# Patient Record
Sex: Female | Born: 1955 | Race: White | Hispanic: No | Smoking: Current every day smoker
Health system: Southern US, Community
[De-identification: ages and names within clinical notes are randomized; demographics above are authoritative.]

## PROBLEM LIST (undated history)

## (undated) DIAGNOSIS — C801 Malignant (primary) neoplasm, unspecified: Secondary | ICD-10-CM

## (undated) HISTORY — PX: CHOLECYSTECTOMY: SHX55

## (undated) HISTORY — PX: CERVICAL CONE BIOPSY: SUR198

## (undated) HISTORY — PX: ABDOMINAL HYSTERECTOMY: SHX81

---

## 1998-02-12 ENCOUNTER — Other Ambulatory Visit: Admission: RE | Admit: 1998-02-12 | Discharge: 1998-02-12 | Payer: Self-pay | Admitting: Obstetrics & Gynecology

## 1998-07-21 ENCOUNTER — Emergency Department (HOSPITAL_COMMUNITY): Admission: EM | Admit: 1998-07-21 | Discharge: 1998-07-21 | Payer: Self-pay | Admitting: Emergency Medicine

## 1998-07-26 ENCOUNTER — Ambulatory Visit (HOSPITAL_COMMUNITY): Admission: RE | Admit: 1998-07-26 | Discharge: 1998-07-26 | Payer: Self-pay | Admitting: Gastroenterology

## 1998-07-26 ENCOUNTER — Encounter: Payer: Self-pay | Admitting: Gastroenterology

## 1998-08-01 ENCOUNTER — Ambulatory Visit (HOSPITAL_COMMUNITY): Admission: RE | Admit: 1998-08-01 | Discharge: 1998-08-01 | Payer: Self-pay | Admitting: Gastroenterology

## 1999-02-04 ENCOUNTER — Ambulatory Visit (HOSPITAL_COMMUNITY): Admission: RE | Admit: 1999-02-04 | Discharge: 1999-02-04 | Payer: Self-pay | Admitting: Infectious Diseases

## 1999-02-04 ENCOUNTER — Encounter: Payer: Self-pay | Admitting: Infectious Diseases

## 1999-05-06 ENCOUNTER — Ambulatory Visit (HOSPITAL_COMMUNITY): Admission: RE | Admit: 1999-05-06 | Discharge: 1999-05-06 | Payer: Self-pay | Admitting: Internal Medicine

## 1999-05-06 ENCOUNTER — Encounter: Payer: Self-pay | Admitting: Internal Medicine

## 1999-08-27 ENCOUNTER — Encounter: Admission: RE | Admit: 1999-08-27 | Discharge: 1999-11-25 | Payer: Self-pay | Admitting: *Deleted

## 1999-09-30 ENCOUNTER — Encounter: Payer: Self-pay | Admitting: *Deleted

## 1999-09-30 ENCOUNTER — Encounter: Admission: RE | Admit: 1999-09-30 | Discharge: 1999-09-30 | Payer: Self-pay | Admitting: *Deleted

## 1999-10-09 ENCOUNTER — Other Ambulatory Visit: Admission: RE | Admit: 1999-10-09 | Discharge: 1999-10-09 | Payer: Self-pay | Admitting: Obstetrics & Gynecology

## 1999-11-25 ENCOUNTER — Encounter: Admission: RE | Admit: 1999-11-25 | Discharge: 1999-12-16 | Payer: Self-pay | Admitting: *Deleted

## 2000-05-03 ENCOUNTER — Encounter: Payer: Self-pay | Admitting: Internal Medicine

## 2000-05-03 ENCOUNTER — Ambulatory Visit (HOSPITAL_COMMUNITY): Admission: RE | Admit: 2000-05-03 | Discharge: 2000-05-03 | Payer: Self-pay | Admitting: Internal Medicine

## 2000-12-08 ENCOUNTER — Other Ambulatory Visit: Admission: RE | Admit: 2000-12-08 | Discharge: 2000-12-08 | Payer: Self-pay | Admitting: Obstetrics & Gynecology

## 2001-05-16 ENCOUNTER — Ambulatory Visit (HOSPITAL_COMMUNITY): Admission: RE | Admit: 2001-05-16 | Discharge: 2001-05-16 | Payer: Self-pay | Admitting: Obstetrics & Gynecology

## 2001-05-16 ENCOUNTER — Encounter: Payer: Self-pay | Admitting: Obstetrics & Gynecology

## 2001-08-18 ENCOUNTER — Ambulatory Visit (HOSPITAL_COMMUNITY): Admission: RE | Admit: 2001-08-18 | Discharge: 2001-08-18 | Payer: Self-pay | Admitting: Gastroenterology

## 2001-08-18 ENCOUNTER — Encounter: Payer: Self-pay | Admitting: Gastroenterology

## 2001-08-23 ENCOUNTER — Emergency Department (HOSPITAL_COMMUNITY): Admission: EM | Admit: 2001-08-23 | Discharge: 2001-08-23 | Payer: Self-pay | Admitting: Emergency Medicine

## 2001-08-30 ENCOUNTER — Encounter: Payer: Self-pay | Admitting: Gastroenterology

## 2001-08-30 ENCOUNTER — Ambulatory Visit (HOSPITAL_COMMUNITY): Admission: RE | Admit: 2001-08-30 | Discharge: 2001-08-30 | Payer: Self-pay | Admitting: Gastroenterology

## 2001-12-15 ENCOUNTER — Other Ambulatory Visit: Admission: RE | Admit: 2001-12-15 | Discharge: 2001-12-15 | Payer: Self-pay | Admitting: Obstetrics & Gynecology

## 2003-01-29 ENCOUNTER — Other Ambulatory Visit: Admission: RE | Admit: 2003-01-29 | Discharge: 2003-01-29 | Payer: Self-pay | Admitting: Obstetrics & Gynecology

## 2004-02-18 ENCOUNTER — Other Ambulatory Visit: Admission: RE | Admit: 2004-02-18 | Discharge: 2004-02-18 | Payer: Self-pay | Admitting: Obstetrics & Gynecology

## 2004-12-04 ENCOUNTER — Ambulatory Visit: Payer: Self-pay | Admitting: Internal Medicine

## 2004-12-17 ENCOUNTER — Ambulatory Visit: Payer: Self-pay | Admitting: Internal Medicine

## 2005-01-14 ENCOUNTER — Ambulatory Visit: Payer: Self-pay | Admitting: Internal Medicine

## 2005-01-28 ENCOUNTER — Ambulatory Visit: Payer: Self-pay | Admitting: Internal Medicine

## 2005-02-24 ENCOUNTER — Ambulatory Visit: Payer: Self-pay | Admitting: Internal Medicine

## 2005-03-24 ENCOUNTER — Ambulatory Visit: Payer: Self-pay | Admitting: Internal Medicine

## 2005-04-23 ENCOUNTER — Ambulatory Visit: Payer: Self-pay | Admitting: Internal Medicine

## 2005-05-14 ENCOUNTER — Ambulatory Visit: Payer: Self-pay | Admitting: Internal Medicine

## 2005-05-25 ENCOUNTER — Ambulatory Visit: Payer: Self-pay | Admitting: Internal Medicine

## 2005-05-27 ENCOUNTER — Ambulatory Visit: Payer: Self-pay | Admitting: Internal Medicine

## 2005-06-23 ENCOUNTER — Ambulatory Visit: Payer: Self-pay | Admitting: Internal Medicine

## 2005-07-23 ENCOUNTER — Ambulatory Visit: Payer: Self-pay | Admitting: Internal Medicine

## 2005-08-24 ENCOUNTER — Ambulatory Visit: Payer: Self-pay | Admitting: Internal Medicine

## 2005-09-23 ENCOUNTER — Ambulatory Visit: Payer: Self-pay | Admitting: Internal Medicine

## 2005-09-29 ENCOUNTER — Ambulatory Visit: Payer: Self-pay | Admitting: Internal Medicine

## 2005-10-20 ENCOUNTER — Ambulatory Visit: Payer: Self-pay | Admitting: Internal Medicine

## 2005-11-17 ENCOUNTER — Ambulatory Visit: Payer: Self-pay | Admitting: Internal Medicine

## 2005-12-17 ENCOUNTER — Ambulatory Visit: Payer: Self-pay | Admitting: Internal Medicine

## 2006-01-18 ENCOUNTER — Ambulatory Visit: Payer: Self-pay | Admitting: Internal Medicine

## 2006-02-15 ENCOUNTER — Ambulatory Visit: Payer: Self-pay | Admitting: Internal Medicine

## 2006-03-29 ENCOUNTER — Ambulatory Visit: Payer: Self-pay | Admitting: Internal Medicine

## 2006-04-27 ENCOUNTER — Ambulatory Visit: Payer: Self-pay | Admitting: Internal Medicine

## 2006-05-06 ENCOUNTER — Ambulatory Visit: Payer: Self-pay | Admitting: Internal Medicine

## 2006-05-07 ENCOUNTER — Ambulatory Visit: Payer: Self-pay | Admitting: Internal Medicine

## 2006-05-21 ENCOUNTER — Ambulatory Visit: Payer: Self-pay | Admitting: Internal Medicine

## 2006-06-03 ENCOUNTER — Ambulatory Visit: Payer: Self-pay | Admitting: Licensed Clinical Social Worker

## 2006-06-10 ENCOUNTER — Ambulatory Visit: Payer: Self-pay | Admitting: Licensed Clinical Social Worker

## 2006-06-16 ENCOUNTER — Ambulatory Visit: Payer: Self-pay | Admitting: Internal Medicine

## 2006-06-17 ENCOUNTER — Ambulatory Visit: Payer: Self-pay | Admitting: Licensed Clinical Social Worker

## 2006-06-25 ENCOUNTER — Ambulatory Visit: Payer: Self-pay | Admitting: Internal Medicine

## 2006-07-05 ENCOUNTER — Ambulatory Visit: Payer: Self-pay | Admitting: Licensed Clinical Social Worker

## 2006-07-28 ENCOUNTER — Ambulatory Visit: Payer: Self-pay | Admitting: Internal Medicine

## 2006-08-13 ENCOUNTER — Ambulatory Visit: Payer: Self-pay | Admitting: Licensed Clinical Social Worker

## 2006-08-27 ENCOUNTER — Encounter: Admission: RE | Admit: 2006-08-27 | Discharge: 2006-08-27 | Payer: Self-pay | Admitting: Internal Medicine

## 2006-09-07 ENCOUNTER — Ambulatory Visit: Payer: Self-pay | Admitting: Internal Medicine

## 2006-09-07 LAB — CONVERTED CEMR LAB
ALT: 12 units/L (ref 0–40)
AST: 17 units/L (ref 0–37)
BUN: 4 mg/dL — ABNORMAL LOW (ref 6–23)
Chloride: 103 meq/L (ref 96–112)
Creatinine, Ser: 0.7 mg/dL (ref 0.4–1.2)
GFR calc non Af Amer: 93 mL/min
Glomerular Filtration Rate, Af Am: 113 mL/min/{1.73_m2}
Potassium: 4.5 meq/L (ref 3.5–5.1)
Sodium: 141 meq/L (ref 135–145)
Total Bilirubin: 0.5 mg/dL (ref 0.3–1.2)

## 2006-09-28 ENCOUNTER — Ambulatory Visit: Payer: Self-pay | Admitting: Internal Medicine

## 2006-10-22 ENCOUNTER — Ambulatory Visit: Payer: Self-pay | Admitting: Internal Medicine

## 2006-10-22 LAB — CONVERTED CEMR LAB
BUN: 9 mg/dL (ref 6–23)
Creatinine, Ser: 0.8 mg/dL (ref 0.4–1.2)

## 2006-11-22 ENCOUNTER — Ambulatory Visit: Payer: Self-pay | Admitting: Internal Medicine

## 2006-12-21 ENCOUNTER — Ambulatory Visit: Payer: Self-pay | Admitting: Internal Medicine

## 2006-12-22 ENCOUNTER — Ambulatory Visit (HOSPITAL_BASED_OUTPATIENT_CLINIC_OR_DEPARTMENT_OTHER): Admission: RE | Admit: 2006-12-22 | Discharge: 2006-12-22 | Payer: Self-pay | Admitting: Internal Medicine

## 2007-01-03 ENCOUNTER — Ambulatory Visit: Payer: Self-pay | Admitting: Pulmonary Disease

## 2007-01-28 ENCOUNTER — Ambulatory Visit: Payer: Self-pay | Admitting: Internal Medicine

## 2007-02-21 ENCOUNTER — Encounter: Admission: RE | Admit: 2007-02-21 | Discharge: 2007-02-21 | Payer: Self-pay | Admitting: Internal Medicine

## 2007-03-09 ENCOUNTER — Ambulatory Visit: Payer: Self-pay | Admitting: Cardiology

## 2007-03-09 ENCOUNTER — Ambulatory Visit: Payer: Self-pay | Admitting: Internal Medicine

## 2007-04-04 ENCOUNTER — Ambulatory Visit: Payer: Self-pay | Admitting: Internal Medicine

## 2007-04-14 DIAGNOSIS — F329 Major depressive disorder, single episode, unspecified: Secondary | ICD-10-CM

## 2007-05-30 ENCOUNTER — Ambulatory Visit: Payer: Self-pay | Admitting: Internal Medicine

## 2007-05-30 DIAGNOSIS — F489 Nonpsychotic mental disorder, unspecified: Secondary | ICD-10-CM

## 2007-05-30 DIAGNOSIS — F5105 Insomnia due to other mental disorder: Secondary | ICD-10-CM | POA: Insufficient documentation

## 2007-05-30 DIAGNOSIS — IMO0001 Reserved for inherently not codable concepts without codable children: Secondary | ICD-10-CM

## 2007-06-08 ENCOUNTER — Telehealth (INDEPENDENT_AMBULATORY_CARE_PROVIDER_SITE_OTHER): Payer: Self-pay | Admitting: *Deleted

## 2007-06-16 ENCOUNTER — Telehealth: Payer: Self-pay | Admitting: Internal Medicine

## 2007-07-08 ENCOUNTER — Telehealth: Payer: Self-pay | Admitting: *Deleted

## 2007-07-15 ENCOUNTER — Telehealth (INDEPENDENT_AMBULATORY_CARE_PROVIDER_SITE_OTHER): Payer: Self-pay | Admitting: *Deleted

## 2007-07-15 ENCOUNTER — Telehealth: Payer: Self-pay | Admitting: Internal Medicine

## 2007-07-20 ENCOUNTER — Telehealth: Payer: Self-pay | Admitting: *Deleted

## 2007-08-08 ENCOUNTER — Ambulatory Visit: Payer: Self-pay | Admitting: Internal Medicine

## 2007-08-12 ENCOUNTER — Telehealth: Payer: Self-pay | Admitting: Internal Medicine

## 2007-08-12 ENCOUNTER — Ambulatory Visit: Payer: Self-pay | Admitting: Internal Medicine

## 2007-08-12 ENCOUNTER — Telehealth: Payer: Self-pay | Admitting: *Deleted

## 2007-08-12 DIAGNOSIS — R5383 Other fatigue: Secondary | ICD-10-CM

## 2007-08-12 DIAGNOSIS — J329 Chronic sinusitis, unspecified: Secondary | ICD-10-CM | POA: Insufficient documentation

## 2007-08-12 DIAGNOSIS — R5381 Other malaise: Secondary | ICD-10-CM

## 2007-08-12 LAB — CONVERTED CEMR LAB
Basophils Absolute: 0 10*3/uL (ref 0.0–0.1)
Eosinophils Absolute: 0.4 10*3/uL (ref 0.0–0.6)
Eosinophils Relative: 2.9 % (ref 0.0–5.0)
HCT: 46.5 % — ABNORMAL HIGH (ref 36.0–46.0)
Lymphocytes Relative: 19 % (ref 12.0–46.0)
MCHC: 35.1 g/dL (ref 30.0–36.0)
MCV: 91.4 fL (ref 78.0–100.0)
Monocytes Absolute: 0.5 10*3/uL (ref 0.2–0.7)
Neutro Abs: 10 10*3/uL — ABNORMAL HIGH (ref 1.4–7.7)
Neutrophils Relative %: 74 % (ref 43.0–77.0)
RBC: 5.09 M/uL (ref 3.87–5.11)
TSH: 1.84 microintl units/mL (ref 0.35–5.50)
WBC: 13.4 10*3/uL — ABNORMAL HIGH (ref 4.5–10.5)

## 2007-08-26 ENCOUNTER — Telehealth: Payer: Self-pay | Admitting: Internal Medicine

## 2007-08-30 ENCOUNTER — Telehealth: Payer: Self-pay | Admitting: Internal Medicine

## 2007-09-01 ENCOUNTER — Telehealth: Payer: Self-pay | Admitting: Internal Medicine

## 2007-09-07 ENCOUNTER — Telehealth (INDEPENDENT_AMBULATORY_CARE_PROVIDER_SITE_OTHER): Payer: Self-pay | Admitting: *Deleted

## 2007-09-09 ENCOUNTER — Telehealth: Payer: Self-pay | Admitting: Internal Medicine

## 2007-12-22 ENCOUNTER — Telehealth: Payer: Self-pay | Admitting: Internal Medicine

## 2011-03-05 ENCOUNTER — Other Ambulatory Visit: Payer: Self-pay | Admitting: Obstetrics & Gynecology

## 2011-03-05 ENCOUNTER — Ambulatory Visit (HOSPITAL_COMMUNITY)
Admission: AD | Admit: 2011-03-05 | Discharge: 2011-03-05 | Disposition: A | Discharge status: Home / Self Care | Payer: Managed Care, Other (non HMO) | Source: Ambulatory Visit | Attending: Family Medicine | Admitting: Family Medicine

## 2011-03-05 ENCOUNTER — Emergency Department (INDEPENDENT_AMBULATORY_CARE_PROVIDER_SITE_OTHER): Payer: Managed Care, Other (non HMO)

## 2011-03-05 ENCOUNTER — Emergency Department (HOSPITAL_BASED_OUTPATIENT_CLINIC_OR_DEPARTMENT_OTHER)
Admission: EM | Admit: 2011-03-05 | Discharge: 2011-03-05 | Disposition: A | Discharge status: Other Acute Inpatient Hospital | Payer: Managed Care, Other (non HMO) | Attending: Emergency Medicine | Admitting: Emergency Medicine

## 2011-03-05 DIAGNOSIS — N857 Hematometra: Secondary | ICD-10-CM | POA: Insufficient documentation

## 2011-03-05 DIAGNOSIS — N938 Other specified abnormal uterine and vaginal bleeding: Secondary | ICD-10-CM | POA: Insufficient documentation

## 2011-03-05 DIAGNOSIS — N950 Postmenopausal bleeding: Secondary | ICD-10-CM

## 2011-03-05 DIAGNOSIS — N882 Stricture and stenosis of cervix uteri: Secondary | ICD-10-CM

## 2011-03-05 DIAGNOSIS — N949 Unspecified condition associated with female genital organs and menstrual cycle: Secondary | ICD-10-CM | POA: Insufficient documentation

## 2011-03-05 DIAGNOSIS — N898 Other specified noninflammatory disorders of vagina: Secondary | ICD-10-CM | POA: Insufficient documentation

## 2011-03-05 DIAGNOSIS — F17200 Nicotine dependence, unspecified, uncomplicated: Secondary | ICD-10-CM | POA: Insufficient documentation

## 2011-03-05 LAB — CBC
HCT: 44 % (ref 36.0–46.0)
Hemoglobin: 14.8 g/dL (ref 12.0–15.0)
MCH: 30.6 pg (ref 26.0–34.0)
MCH: 30.5 pg (ref 26.0–34.0)
MCHC: 33.6 g/dL (ref 30.0–36.0)
MCV: 90.9 fL (ref 78.0–100.0)
MCV: 93.7 fL (ref 78.0–100.0)
Platelets: 194 10*3/uL (ref 150–400)
RDW: 13.5 % (ref 11.5–15.5)
RDW: 13.6 % (ref 11.5–15.5)
WBC: 9.7 10*3/uL (ref 4.0–10.5)

## 2011-03-05 LAB — DIFFERENTIAL
Basophils Absolute: 0 10*3/uL (ref 0.0–0.1)
Eosinophils Relative: 2 % (ref 0–5)
Lymphocytes Relative: 22 % (ref 12–46)
Lymphs Abs: 2 10*3/uL (ref 0.7–4.0)
Monocytes Absolute: 0.6 10*3/uL (ref 0.1–1.0)
Monocytes Relative: 7 % (ref 3–12)
Neutro Abs: 6.4 10*3/uL (ref 1.7–7.7)

## 2011-03-05 LAB — TYPE AND SCREEN: ABO/RH(D): A POS

## 2011-03-05 LAB — URINALYSIS, ROUTINE W REFLEX MICROSCOPIC
Glucose, UA: NEGATIVE mg/dL
Leukocytes, UA: NEGATIVE
Protein, ur: NEGATIVE mg/dL
Specific Gravity, Urine: 1.015 (ref 1.005–1.030)

## 2011-03-05 LAB — WET PREP, GENITAL: Trich, Wet Prep: NONE SEEN

## 2011-03-05 LAB — URINE MICROSCOPIC-ADD ON

## 2011-03-06 LAB — GC/CHLAMYDIA PROBE AMP, GENITAL: Chlamydia, DNA Probe: NEGATIVE

## 2011-03-06 NOTE — Letter (Signed)
August 16, 2006     Ms. [name] [name]  c/o [employer]  [address]  [place], [place]  [address]   RE:  SILVANA, HOLECEK  MRN:  [id number]  /  DOB:  04/13/1956   Dear Ms. Urso:   I am writing in response to your request for information regarding Marnie Fazzino.  I have seen her 3 times since June 03, 2006.  She was referred by  her primary care physician Dr. Darryll Capers.  She suffers from depression  and anxiety and has been unable to work for some time due to fibromyalgia  and migraine headaches.  Ms. Pruden is struggling with some family issues in  terms of having to care for her elderly parents.  Her mother is severely  mentally ill, and this is exceedingly difficult for Ms. Bednarczyk because she  receives no help from her sister with this issue.  Ms. Barnhardt is frequently  ill, consequently has been unable to attend 6 of 9 scheduled appointments.  She frequently has headaches or fever or colds or other conditions which  keep her in the house.   I have completed the questionnaire which you enclosed.  All of the  information which I have gathered from Ms.  Maltese in our 3 sessions together  which occurred on August 16, August 30, and July 05, 2006 is included  in this completed questionnaire.   I hope the above is helpful to you in evaluating Ms. Tomeo's claim for  disability services.    Sincerely,    ______________________________  Judithe Modest, MSW, LCSW    [facility]  Phone:  [phone]  Fax:  [phone]   SB/MedQ  DD: 08/17/2006  DT: 08/17/2006  Job #: [id number]

## 2011-03-06 NOTE — Procedures (Signed)
NAME:  [name] [name], [name] [name]               ACCOUNT NO.:  [id number]   MEDICAL RECORD NO.:  [id number]          PATIENT TYPE:  OUT   LOCATION:  SLEEP CENTER                 FACILITY:  [facility]   PHYSICIAN:  Barbaraann Share, MD,FCCPDATE OF BIRTH:  Aug 13, 1954   DATE OF STUDY:  12/22/2006                            NOCTURNAL POLYSOMNOGRAM   INDICATION FOR STUDY:  Persistent disorder of initiating and maintaining  sleep.   EPWORTH SLEEPINESS SCORE:  5.   MEDICATIONS:   SLEEP ARCHITECTURE:  The patient had a total sleep time of only 221  minutes and never achieved slow wave sleep or REM.  Sleep onset latency  was very prolonged at 162 minutes, and sleep efficiency was extremely  poor at 45%.   RESPIRATORY DATA:  The patient was found to have 8 hypopneas and 7  apneas for an apnea-hypopnea index of 4 events per hour.  The events  were more common in the supine position, and there was moderate snoring  noted throughout.   OXYGEN DATA:  His O2 desaturation is as low as 84% with the patient's  obstructive events.   CARDIAC DATA:  No clinically significant cardiac arrhythmias were noted.   MOVEMENT-PARASOMNIA:  The patient was found to have 22 leg jerks with  less than 1 per hour resulting in arousal or awakening.   IMPRESSIONS-RECOMMENDATIONS:  Small numbers of obstructive events which  do not meet the apnea-hypopnea index criteria for the obstructive sleep  apnea syndrome.  There were no significant leg jerks or other obvious  disrupters to sleep.  More than likely the patient's symptomatology is  related to insomnia.  Clinical correlation is suggested.      Barbaraann Share, MD,FCCP  Diplomate, American Board of Sleep  Medicine  Electronically Signed     [facility]/MEDQ  D:  01/03/2007 11:44:37  T:  01/03/2007 16:31:55  Job:  [id number]

## 2011-03-09 NOTE — Op Note (Signed)
NAME:  [name] [name], [name] [name]               ACCOUNT NO.:  [id number]  MEDICAL RECORD NO.:  [id number]           PATIENT TYPE:  O  LOCATION:  [facility]                          [facility]  PHYSICIAN:  Horton Chin, MD DATE OF BIRTH:  [birth year]  DATE OF PROCEDURE:  03/05/2011 DATE OF DISCHARGE:                              OPERATIVE REPORT   PREOPERATIVE DIAGNOSIS:  Hematometrocolpos.  POSTOPERATIVE DIAGNOSES:  Hematometrocolpos, cervical stenosis.  PROCEDURE:  Dilation and curettage.  SURGEON:  Horton Chin, MD  INDICATIONS:  The patient is a 55 year old postmenopausal woman who presented with postmenopausal bleeding after 15-20 years without any bleeding.  The patient first presented to the [facility] where an ultrasound was done and showed a 14 weeks size uterus with about 8 cm of blood distending the endometrial canal down to the cervix, and a stricture was noted close to the external cervical os.  Given these findings, the patient was sent to [facility] for further evaluation.  On evaluation, the patient was noted to have minimal pain and minimal bleeding.  Her hemoglobin was noted to be stable at 14.5, but given the concern about this condition she was counseled regarding needing evacuation of this hematometrocolpos.  Dilation and curettage was recommended, and the risks of surgery including bleeding, infection, injury to surrounding organs, need for additional procedures were explained to the patient, and written informed consent was obtained.  FINDINGS:  The patient had significant cervical stenosis at her external cervical os, but once this stenosis was able to be breached, she had a significant amount of dark brown blood drainage.  Her uterus sounded to  14 cm.  Upon curettage, she has small amount of endometrial curettings.  ANESTHESIOLOGIST:  Brayton Caves, MD  ANESTHESIA:  General LMA.  IV FLUIDS:  1100 mL of lactated  Ringer's.  ESTIMATED BLOOD LOSS:  50 mL.  SPECIMENS:  Endometrial curettings, which were sent to Pathology.  COMPLICATIONS:  None immediate.  PROCEDURE DETAILS:  The patient was taken to the operating room where general analgesia was administered and found to be adequate.  She was then placed in a dorsal lithotomy position and prepped and draped in a sterile manner.  Her bladder was catheterized for unmeasured amount of clear yellow urine.  After an adequate time-out was performed, an attempt was made to place a speculum into her vagina, but the patient does have significant atrophy and stenosis of the introitus, which made it very difficult to open the vaginal speculum. As such the Sims retractor was used to visualize the cervix, which was also difficult to identify as it was flush with the vaginal walls and very atrophic. This was grasped with a tenaculum.  There was a small amount of dark  blood that was coming out of a pinpoint area on the external os. It was significantly stenosed, but using small dilators and building up to the bigger dilators, this stenosis was able to be overcome.  Significant amount of dark brown blood was noted to come out.  After the extrusion of blood, the 7-mm suction curette was advanced into the uterine fundus and  more blood was able to be suctioned out using the suction curette. Sharp curettage was then done to obtain small amount of endometrial tissue.  There were no lesions that were felt, and multiple passes were made to get enough endometrial curettings for pathologic analysis.  The patient had minimal bleeding at the end of the procedure and tolerated the procedure well.  Sponge, needle, and instrument counts were correct x2.  She was taken to recovery room, awake, extubated, and in stable condition.  DISPOSITION: The patient was advised to call the [facility] in 2 weeks for a postoperative evaluation.  Routine postoperative  instructions were given to the patient with the same-day surgery discharge  instructions after D and C.  She was given prescriptions for Doxycycline 7-day course, Percocet, Motrin, and Colace, and she was encouraged to come to the [facility] if she has any postoperative concerns prior to her appointment.     Horton Chin, MD     [facility]/MEDQ  D:  03/05/2011  T:  03/06/2011  Job:  [id number]  Electronically Signed by Jaynie Collins MD on 03/09/2011 10:19:14 AM

## 2011-03-12 ENCOUNTER — Ambulatory Visit: Payer: Managed Care, Other (non HMO)

## 2011-04-02 ENCOUNTER — Ambulatory Visit: Payer: Managed Care, Other (non HMO) | Admitting: Obstetrics & Gynecology

## 2011-04-02 ENCOUNTER — Other Ambulatory Visit: Payer: Self-pay | Admitting: Obstetrics and Gynecology

## 2011-04-02 ENCOUNTER — Ambulatory Visit: Payer: Managed Care, Other (non HMO)

## 2011-04-02 DIAGNOSIS — C55 Malignant neoplasm of uterus, part unspecified: Secondary | ICD-10-CM

## 2011-04-02 DIAGNOSIS — Z1231 Encounter for screening mammogram for malignant neoplasm of breast: Secondary | ICD-10-CM

## 2011-04-03 NOTE — Group Therapy Note (Unsigned)
NAME:  [name] [name], [name] [name]               ACCOUNT NO.:  [id number]  MEDICAL RECORD NO.:  [id number]           PATIENT TYPE:  A  LOCATION:  [facility]                   FACILITY:  [facility]  PHYSICIAN:  Jaynie Collins, MD     DATE OF BIRTH:  1955-11-27  DATE OF SERVICE:  04/02/2011                                 CLINIC NOTE  REASON FOR VISIT:  Postoperative visit.  Ms. [name] [name] is a 55 year old postmenopausal woman who underwent a dilation and curettage for hematometrocolpos and cervical stenosis in the setting of postmenopausal bleeding.  The patient did first present to the [facility] where her hematometrocolpos was diagnosed and the patient continued to have significant bleeding, so she was counseled regarding a dilation and curettage when she was transferred to [facility].  She underwent an uncomplicated dilation and curettage.  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  An attempt was made to contact the patient multiple times, but the correct number was not in our system.  Fortunately, she showed up today for postoperative appointment and review of results.  The diagnosis was shared with the patient and she was told about the need to meet with a gynecologic oncologist for further evaluation.  Some of her questions were answered, but most of her questions answered upon management of this cancer and she was told she will likely need surgical __________ it is difficult at this point to see if she is going to need radiation or chemotherapy.  We will leave that up to the gynecologic oncologist to make the decision about that.  She was told that she will be called with the appointment to the gynecologic oncology specialist and attempt was made to Telford Nab today, but she was not in her office and so we will try again to call her  tomorrow and to set up the appointment.  The patient was given appropriate support and was in good spirits throughout the encounter.  It is also noted that the patient has not had a Pap smear since 2006 and that her last mammogram was in 2006 also, and a mammogram will be scheduled at the end of this encounter for her preventative health maintenance.  The patient was told to expect a call from [place].  From a postoperative standpoint, she is doing okay.  She denies any further bleeding or any other concerns.          ______________________________ Jaynie Collins, MD    UA/MEDQ  D:  04/02/2011  T:  04/03/2011  Job:  [id number]

## 2011-04-06 ENCOUNTER — Other Ambulatory Visit: Payer: Self-pay | Admitting: Obstetrics & Gynecology

## 2011-04-06 DIAGNOSIS — C55 Malignant neoplasm of uterus, part unspecified: Secondary | ICD-10-CM

## 2011-04-08 ENCOUNTER — Ambulatory Visit (HOSPITAL_COMMUNITY)
Admission: RE | Admit: 2011-04-08 | Discharge: 2011-04-08 | Disposition: A | Discharge status: Home / Self Care | Payer: Managed Care, Other (non HMO) | Source: Ambulatory Visit | Attending: Obstetrics & Gynecology | Admitting: Obstetrics & Gynecology

## 2011-04-08 DIAGNOSIS — C55 Malignant neoplasm of uterus, part unspecified: Secondary | ICD-10-CM

## 2011-04-08 DIAGNOSIS — K5730 Diverticulosis of large intestine without perforation or abscess without bleeding: Secondary | ICD-10-CM | POA: Insufficient documentation

## 2011-04-08 DIAGNOSIS — N857 Hematometra: Secondary | ICD-10-CM | POA: Insufficient documentation

## 2011-04-08 MED ORDER — IOHEXOL 300 MG/ML  SOLN
100.0000 mL | Freq: Once | INTRAMUSCULAR | Status: AC | PRN
  Administered 2011-04-08: 100 mL via INTRAVENOUS

## 2011-04-10 ENCOUNTER — Ambulatory Visit: Payer: Managed Care, Other (non HMO) | Attending: Gynecology | Admitting: Gynecology

## 2011-04-10 DIAGNOSIS — N889 Noninflammatory disorder of cervix uteri, unspecified: Secondary | ICD-10-CM | POA: Insufficient documentation

## 2011-04-10 DIAGNOSIS — Z9089 Acquired absence of other organs: Secondary | ICD-10-CM | POA: Insufficient documentation

## 2011-04-10 DIAGNOSIS — E669 Obesity, unspecified: Secondary | ICD-10-CM | POA: Insufficient documentation

## 2011-04-10 DIAGNOSIS — F17200 Nicotine dependence, unspecified, uncomplicated: Secondary | ICD-10-CM | POA: Insufficient documentation

## 2011-04-10 DIAGNOSIS — N950 Postmenopausal bleeding: Secondary | ICD-10-CM | POA: Insufficient documentation

## 2011-04-13 NOTE — Consult Note (Signed)
NAME:  [name] [name], [name] [name]               ACCOUNT NO.:  [id number]  MEDICAL RECORD NO.:  [id number]  LOCATION:  GYN                          FACILITY:  [facility]  PHYSICIAN:  De Blanch, M.D.DATE OF BIRTH:  [birth year]  DATE OF CONSULTATION:  04/10/2011 DATE OF DISCHARGE:                                CONSULTATION   CHIEF COMPLAINT:  Postmenopausal bleeding and possible cervical cancer.  HISTORY OF PRESENT ILLNESS:  This is a 55 year old [place] female who presented with postmenopausal bleeding.  She has cervical stenosis secondary to prior cervical surgery (LEEP or CKC).  She underwent a D and C on Mar 05, 2011, which revealed high-grade squamous lesion which was possibly an invasive lesion.  No endometrial tissue was obtained on the D and C.  The pathology report has been reviewed and there remains lack of certainty as to whether this is an invasive cervical cancer.  The patient does give a past history of abnormal Pap smears and underwent a cold knife conization in the late 1990s.  She claims that Pap smears have been normal since that time, although it appears that it has been several years since she had a Pap smear.  CURRENT MEDICAL ILLNESSES:  None except obesity.  PAST SURGICAL HISTORY:  Laparoscopic cholecystectomy and cold knife conization  CURRENT MEDICATIONS:  None.  DRUG ALLERGIES:  None.  FAMILY HISTORY:  Negative for gynecologic, breast or colon cancer.  SOCIAL HISTORY:  The patient is divorced.  She smokes one-half pack per day.  REVIEW OF SYSTEMS:  Ten-point comprehensive review of systems negative except as noted above.  PHYSICAL EXAMINATION:  VITAL SIGNS:  Height 5 feet 3, weight 218 pounds, blood pressure 120/74, pulse 88, respiratory rate 16, temperature 98.4. GENERAL:  In general, the patient is healthy pleasant Ishibashi female, in no acute distress. HEENT:  Negative. NECK:  Supple without thyromegaly.  There is no supraclavicular or inguinal  adenopathy. ABDOMEN:  Soft, nontender.  No masses, organomegaly, ascites or hernias noted. PELVIC EXAM:  EGBUS, vagina, bladder, urethra are normal.  The cervix is flush with a vaginal vault and the cervical os is difficult to identify and looks extremely stenotic.  No gross lesions are noted.  Bimanual exam reveals a cervix which is mobile, nontender and seems appropriate for size.  It is difficult to get a good estimate of the size of the uterus.  There is no parametrial involvement.  Rectovaginal exam confirms.  IMPRESSION:  Dilatation and curettage specimen which suggests carcinoma in situ or possibly invasive cancer.  I had a lengthy discussion with the patient and her sister regarding the rationale for performing a cold knife conization.  They are aware that this may be difficult given her given her prior surgical procedures on the cervix but nonetheless we should attempt this in order to try to establish whether she has only carcinoma in situ or whether in fact she has an invasive carcinoma.  They understand that the difference in treatment approaches to these 2 conditions are quite different and that appropriate therapy is mandatory.  Risks of surgery including bleeding, infection, injury to adjacent viscera, uterine perforation and inconclusive results were all discussed.  Questions were answered.  We will go ahead and schedule her for my next available operating day which is May 05, 2011.     De Blanch, M.D.     DC/MEDQ  D:  04/10/2011  T:  04/10/2011  Job:  [id number]  cc:   Horton Chin, MD  Telford Nab, R.N. [address] [place], [place] [address]  Electronically Signed by De Blanch M.D. on 04/13/2011 01:01:23 PM

## 2011-04-15 ENCOUNTER — Ambulatory Visit (HOSPITAL_COMMUNITY)
Admission: RE | Admit: 2011-04-15 | Discharge: 2011-04-15 | Disposition: A | Discharge status: Home / Self Care | Payer: Managed Care, Other (non HMO) | Source: Ambulatory Visit | Attending: Obstetrics and Gynecology | Admitting: Obstetrics and Gynecology

## 2011-04-15 DIAGNOSIS — Z1231 Encounter for screening mammogram for malignant neoplasm of breast: Secondary | ICD-10-CM

## 2011-05-01 ENCOUNTER — Encounter (HOSPITAL_COMMUNITY): Payer: Managed Care, Other (non HMO)

## 2011-05-01 ENCOUNTER — Other Ambulatory Visit: Payer: Self-pay | Admitting: Gynecology

## 2011-05-01 LAB — CBC
HCT: 44.6 % (ref 36.0–46.0)
MCV: 93.5 fL (ref 78.0–100.0)
RBC: 4.77 MIL/uL (ref 3.87–5.11)
WBC: 11.5 10*3/uL — ABNORMAL HIGH (ref 4.0–10.5)

## 2011-05-01 LAB — BASIC METABOLIC PANEL
BUN: 9 mg/dL (ref 6–23)
CO2: 27 mEq/L (ref 19–32)
Chloride: 104 mEq/L (ref 96–112)
Creatinine, Ser: 0.66 mg/dL (ref 0.50–1.10)
Glucose, Bld: 98 mg/dL (ref 70–99)

## 2011-05-01 LAB — DIFFERENTIAL
Lymphocytes Relative: 20 % (ref 12–46)
Lymphs Abs: 2.3 10*3/uL (ref 0.7–4.0)
Monocytes Relative: 5 % (ref 3–12)
Neutrophils Relative %: 74 % (ref 43–77)

## 2011-05-01 LAB — SURGICAL PCR SCREEN: MRSA, PCR: NEGATIVE

## 2011-05-05 ENCOUNTER — Ambulatory Visit (HOSPITAL_COMMUNITY)
Admission: RE | Admit: 2011-05-05 | Discharge: 2011-05-05 | Disposition: A | Discharge status: Home / Self Care | Payer: Managed Care, Other (non HMO) | Source: Ambulatory Visit | Attending: Gynecology | Admitting: Gynecology

## 2011-05-05 ENCOUNTER — Other Ambulatory Visit: Payer: Self-pay | Admitting: Gynecology

## 2011-05-05 DIAGNOSIS — Z79899 Other long term (current) drug therapy: Secondary | ICD-10-CM | POA: Insufficient documentation

## 2011-05-05 DIAGNOSIS — N879 Dysplasia of cervix uteri, unspecified: Secondary | ICD-10-CM | POA: Insufficient documentation

## 2011-05-05 DIAGNOSIS — N938 Other specified abnormal uterine and vaginal bleeding: Secondary | ICD-10-CM | POA: Insufficient documentation

## 2011-05-05 DIAGNOSIS — F17200 Nicotine dependence, unspecified, uncomplicated: Secondary | ICD-10-CM | POA: Insufficient documentation

## 2011-05-05 DIAGNOSIS — Z01812 Encounter for preprocedural laboratory examination: Secondary | ICD-10-CM | POA: Insufficient documentation

## 2011-05-05 DIAGNOSIS — N949 Unspecified condition associated with female genital organs and menstrual cycle: Secondary | ICD-10-CM | POA: Insufficient documentation

## 2011-05-07 NOTE — Op Note (Signed)
  NAME:  Gillette, Christa               ACCOUNT NO.:  [id number]  MEDICAL RECORD NO.:  [id number]  LOCATION:  [facility]                         FACILITY:  [facility]  PHYSICIAN:  De Blanch, M.D.DATE OF BIRTH:  [birth year]  DATE OF PROCEDURE:  05/05/2011 DATE OF DISCHARGE:                              OPERATIVE REPORT   PREOPERATIVE DIAGNOSIS:  Abnormal uterine bleeding, cervical dysplasia and possible cervical cancer.  POSTOPERATIVE DIAGNOSIS:  Abnormal uterine bleeding, cervical dysplasia and possible cervical cancer.  PROCEDURE:  Examination under anesthesia, cold knife conization, and dilatation and curettage.  SURGEON:  De Blanch, M.D.  FIRST ASSISTANT:  Telford Nab, RN  ANESTHESIA:  General LMA.  ESTIMATED BLOOD LOSS:  Minimal.  SURGICAL FINDINGS:  Examination under anesthesia revealed a cervix which was flushed with the vaginal vault.  The cervical os was very stenotic. The uterus ultimately sounded to approximately 7 cm anteriorly.  There were no adnexal masses.  There was no obvious malignancy.  The uterus contained approximately 30 cc of dark old blood.  DESCRIPTION OF PROCEDURE:  The patient brought to the operating room and after satisfactory attainment of general anesthesia was placed in lithotomy position in [place] stirrups.  The vulva and vagina were prepped with Betadine and the patient was draped.  Beaver retractors were used to expose the cervix which was flushed at the vaginal apex.  I was unable to grasp the cervix with tenaculum.  We placed 2 sutures lateral to the cervix for traction.  The cervix was then injected with 1% lidocaine with epinephrine.  Using lacrimal duct probes, the cervical os was identified and then we gradually dilated to a 23 Pratt dilator. This released considerable amount of dark old blood.  A circumferential incision was made around the cervical os.  The 2-0 silk sutures were placed in the cervical specimen for  traction.  Pulling down the sutures, the remainder of the conization was completed.  Some bleeding was noted from the vaginal edges which was cauterized.  Additional cautery of the cervical stroma was accomplished.  The endocervical curettage was performed and then endometrial curettage was performed and submitted as separate specimens.  A small piece of Surgicel was placed in the cone bed and 2-0 Vicryl sutures were then used to reapproximate the cervical edges.  This essentially closed the cervix off.  At the completion of procedure, hemostasis was excellent.  The patient was awakened from anesthesia and taken to the recovery room in satisfactory condition.  Sponge, needle and instrument counts were correct x2.     De Blanch, M.D.     DC/MEDQ  D:  05/05/2011  T:  05/05/2011  Job:  [id number]  cc:   Telford Nab, R.N. [address] [place], [place] [address]  Dr. Cori Razor  Electronically Signed by De Blanch M.D. on 05/07/2011 01:22:17 PM

## 2011-06-05 ENCOUNTER — Ambulatory Visit: Payer: Managed Care, Other (non HMO) | Attending: Gynecology | Admitting: Gynecology

## 2011-06-05 DIAGNOSIS — F17200 Nicotine dependence, unspecified, uncomplicated: Secondary | ICD-10-CM | POA: Insufficient documentation

## 2011-06-05 DIAGNOSIS — C549 Malignant neoplasm of corpus uteri, unspecified: Secondary | ICD-10-CM | POA: Insufficient documentation

## 2011-06-05 DIAGNOSIS — E669 Obesity, unspecified: Secondary | ICD-10-CM | POA: Insufficient documentation

## 2011-06-05 DIAGNOSIS — Z9089 Acquired absence of other organs: Secondary | ICD-10-CM | POA: Insufficient documentation

## 2011-06-08 NOTE — Consult Note (Signed)
  NAME:  [name], [name]               ACCOUNT NO.:  [id number]  MEDICAL RECORD NO.:  [id number]  LOCATION:  GYN                          FACILITY:  [facility]  PHYSICIAN:  De Blanch, M.D.DATE OF BIRTH:  [birth year]  DATE OF CONSULTATION:06/05/2011 DATE OF DISCHARGE:                                CONSULTATION   CHIEF COMPLAINT:  Endometrial squamous cell carcinoma.  INTERVAL HISTORY:  The patient returns today for review of her pathology.  She underwent a cold knife conization and D and C on July 17.  The conization specimen had no abnormalities in it.  However, the endometrial curettings revealed squamous cell carcinoma.  Review of the pathologist's comments is unclear as to whether this is an invasive squamous cell carcinoma or carcinoma in situ, although there is a strong suspicion as this is invasive.  The patient has had an uncomplicated postoperative course.  Denies any bleeding, any fever, chills, or pain.  PAST MEDICAL HISTORY:  Obesity.  PAST SURGICAL HISTORY:  Laparoscopic cholecystectomy and cold knife conization with D and C.  CURRENT MEDICATIONS:  None.  DRUG ALLERGIES:  None.  FAMILY HISTORY:  Negative for gynecologic, breast, or colon cancer.  SOCIAL HISTORY:  The patient is divorced.  She smokes a half-pack per day.  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  REVIEW OF SYSTEMS:  A 10-point comprehensive review of systems is negative except as noted above.  PHYSICAL EXAMINATION:  VITAL SIGNS:  Weight 218 pounds, blood pressure 138/80. GENERAL:  Patient is a pleasant [name] female, in no acute distress. HEENT:  Negative. NECK:  Supple without thyromegaly. ABDOMEN:  Soft, nontender.  No mass, organomegaly, ascites, or hernias noted. PELVIC EXAM:  EGBUS, vagina, bladder, urethra are normal.  Cervix is post conization, some Vicryl sutures remained.  There is no bleeding and no discharge.  Bimanual exam reveals no tenderness, good uterine  and cervical mobility.  No adnexal masses or parametrial involvement.  IMPRESSION:  Patient apparently has a squamous cell carcinoma arising in the endometrium.  I discussed the pathologic findings with the patient and her sister and would recommend she undergo a total abdominal hysterectomy, bilateral salpingo-oophorectomy, and intraoperative frozen section.  If invasive squamous cell carcinoma is identified, then proceeding with pelvic lymphadenectomy under the same anesthetic would be recommended.  The risks of surgery have been discussed with the patient and all of her questions were answered.  We will allow her cervix to heal further and have scheduled surgery for September 18.     De Blanch, M.D.     DC/MEDQ  D:  06/05/2011  T:  06/06/2011  Job:  [id number]  cc:   Horton Chin, MD  Telford Nab, R.N. [address] [place], [place] [address]  Electronically Signed by De Blanch M.D. on 06/08/2011 11:31:05 AM

## 2011-07-03 ENCOUNTER — Other Ambulatory Visit: Payer: Self-pay | Admitting: Obstetrics & Gynecology

## 2011-07-03 ENCOUNTER — Other Ambulatory Visit: Payer: Self-pay | Admitting: Gynecology

## 2011-07-03 ENCOUNTER — Encounter (HOSPITAL_COMMUNITY): Payer: Managed Care, Other (non HMO)

## 2011-07-03 LAB — CBC
HCT: 45.2 % (ref 36.0–46.0)
Hemoglobin: 14.9 g/dL (ref 12.0–15.0)
MCHC: 33 g/dL (ref 30.0–36.0)

## 2011-07-03 LAB — DIFFERENTIAL
Basophils Absolute: 0 10*3/uL (ref 0.0–0.1)
Lymphocytes Relative: 24 % (ref 12–46)
Lymphs Abs: 2 10*3/uL (ref 0.7–4.0)
Monocytes Absolute: 0.4 10*3/uL (ref 0.1–1.0)
Monocytes Relative: 5 % (ref 3–12)
Neutro Abs: 5.6 10*3/uL (ref 1.7–7.7)

## 2011-07-03 LAB — COMPREHENSIVE METABOLIC PANEL
Alkaline Phosphatase: 97 U/L (ref 39–117)
BUN: 13 mg/dL (ref 6–23)
GFR calc Af Amer: >60 mL/min (ref >60)
Glucose, Bld: 101 mg/dL — ABNORMAL HIGH (ref 70–99)
Potassium: 4.7 mEq/L (ref 3.5–5.1)
Total Bilirubin: 0.2 mg/dL — ABNORMAL LOW (ref 0.3–1.2)
Total Protein: 7.7 g/dL (ref 6.0–8.3)

## 2011-07-03 LAB — ABO/RH: ABO/RH(D): A POS

## 2011-07-03 LAB — SURGICAL PCR SCREEN: Staphylococcus aureus: NEGATIVE

## 2011-07-07 ENCOUNTER — Other Ambulatory Visit: Payer: Self-pay | Admitting: Gynecology

## 2011-07-07 ENCOUNTER — Inpatient Hospital Stay (HOSPITAL_COMMUNITY)
Admission: RE | Admit: 2011-07-07 | Discharge: 2011-07-15 | DRG: 740 | Disposition: A | Discharge status: Home / Self Care | Payer: Managed Care, Other (non HMO) | Source: Ambulatory Visit | Attending: Obstetrics & Gynecology | Admitting: Obstetrics & Gynecology

## 2011-07-07 DIAGNOSIS — K560 Paralytic ileus: Secondary | ICD-10-CM | POA: Diagnosis not present

## 2011-07-07 DIAGNOSIS — IMO0001 Reserved for inherently not codable concepts without codable children: Secondary | ICD-10-CM | POA: Diagnosis present

## 2011-07-07 DIAGNOSIS — F17200 Nicotine dependence, unspecified, uncomplicated: Secondary | ICD-10-CM | POA: Diagnosis present

## 2011-07-07 DIAGNOSIS — Z01812 Encounter for preprocedural laboratory examination: Secondary | ICD-10-CM

## 2011-07-07 DIAGNOSIS — C549 Malignant neoplasm of corpus uteri, unspecified: Principal | ICD-10-CM | POA: Diagnosis present

## 2011-07-07 HISTORY — DX: Malignant (primary) neoplasm, unspecified: C80.1

## 2011-07-08 LAB — BASIC METABOLIC PANEL
CO2: 24 mEq/L (ref 19–32)
Calcium: 7.9 mg/dL — ABNORMAL LOW (ref 8.4–10.5)
Creatinine, Ser: 0.71 mg/dL (ref 0.50–1.10)
GFR calc non Af Amer: >60 mL/min (ref >60)
Sodium: 134 mEq/L — ABNORMAL LOW (ref 135–145)

## 2011-07-08 LAB — CBC
HCT: 37.5 % (ref 36.0–46.0)
Hemoglobin: 11.8 g/dL — ABNORMAL LOW (ref 12.0–15.0)
MCHC: 31.5 g/dL (ref 30.0–36.0)
RBC: 3.96 MIL/uL (ref 3.87–5.11)
WBC: 9.7 10*3/uL (ref 4.0–10.5)

## 2011-07-10 NOTE — Op Note (Signed)
NAME:  [name] [name], [name] [name]               ACCOUNT NO.:  [id number]  MEDICAL RECORD NO.:  [id number]  LOCATION:  0007                         FACILITY:  [facility]  PHYSICIAN:  De Blanch, M.D.DATE OF BIRTH:  04/06/1956  DATE OF PROCEDURE:  07/07/2011 DATE OF DISCHARGE:                              OPERATIVE REPORT   PREOPERATIVE DIAGNOSIS:  Squamous cell carcinoma of the endometrium.  POSTOPERATIVE DIAGNOSIS:  Squamous cell carcinoma of the endometrium.  PROCEDURE:  Total abdominal hysterectomy, bilateral salpingo- oophorectomy, pelvic lymphadenectomy.  SURGEON:  De Blanch, M.D.  ASSISTANT: 1. Antionette Char, M.D. 2. Telford Nab, R.N.  ANESTHESIA:  General with orotracheal tube.  ESTIMATED BLOOD LOSS:  250 cc.  SURGICAL FINDINGS:  At the time of exploratory laparotomy, the upper abdomen and pelvis were explored and found to be normal.  The uterus, tubes and ovaries appeared to be normal.  The uterus was small.  On frozen section, there was a small amount of carcinoma in the uterine fundus, well away from the cervix.  There is no evidence of myometrial invasion.  PROCEDURE:  The patient was brought to the operating room after satisfactory attainment of general anesthesia, was placed in a modified lithotomy position in [place] stirrups.  The anterior abdominal wall, perineum and vagina were prepped.  Foley catheter was inserted and the patient was draped.  The abdomen was entered through a low midline incision.  Pelvic washings were obtained and sent to cytopathology. Bookwalter retractor was positioned.  The upper abdomen and pelvis were explored with the above-noted findings.  Small bowel was packed out of the pelvis.  The uterus was grasped with long Kelly clamps.  Round ligaments were divided with cautery and the retroperitoneal spaces opened identifying the vessels and ureter.  The ovarian vessels were skeletonized, clamped, cut free tied and suture  ligated.  The bladder flap was advanced to sharp and blunt dissection.  Uterine vessels were skeletonized and clamped, cut and suture ligated in a stepwise fashion. The paracervical and cardinal ligaments were clamped, cut and suture ligated.  We advanced the bladder further off the vagina to be certain that we were able to incorporate the entire cervix, which had previously undergone a cold knife conization.  Ultimately, clamps were placed across the upper vagina and this was divided and transected from the cervix.  Cervix was inspected and found to be removed entirely.  The vaginal angles were transfixed with 0-Vicryl, central portion of the vagina closed with interrupted figure-of-eight sutures of 0 Vicryl.  Attention was turned to performing a pelvic lymphadenectomy.  Lymph nodes overlying the external iliac artery, vein, internal iliac artery and obturator fossa were excised.  Care was taken to avoid injury to the vessels, ureter, or obturator nerve.  Frozen section returned showing minimal amount of cancer and therefore, we decided to terminate the surgical procedure.  The pelvis was irrigated with saline.  The packs and retractors were removed.  The anterior abdominal wall was closed in layers, first being a running mass closure using #1 PDS.  Isuprel was injected into the subcutaneous tissue and the fascia using 40 cc. Subcutaneous tissue was reapproximated with interrupted 3-0 Vicryl sutures.  Skin was closed with  skin staples and dressing was applied. The patient was awakened from anesthesia and taken to the recovery room in satisfactory condition.  Sponge, needle and instrument counts correct x2.     De Blanch, M.D.     DC/MEDQ  D:  07/07/2011  T:  07/07/2011  Job:  [id number]  cc:   Roseanna Rainbow, M.D. Fax: [phone]  Telford Nab, R.N. [address] [place], [place] [address]  Horton Chin, MD  Electronically Signed by De Blanch M.D. on 07/10/2011 07:15:58 AM

## 2011-07-11 ENCOUNTER — Inpatient Hospital Stay (HOSPITAL_COMMUNITY): Payer: Managed Care, Other (non HMO)

## 2011-07-11 LAB — TYPE AND SCREEN
ABO/RH(D): A POS
Antibody Screen: POSITIVE
DAT, IgG: NEGATIVE
PT AG Type: NEGATIVE
Unit division: 0
Unit division: 0

## 2011-07-11 LAB — CBC
MCHC: 31.8 g/dL (ref 30.0–36.0)
RDW: 14.3 % (ref 11.5–15.5)
WBC: 8.4 10*3/uL (ref 4.0–10.5)

## 2011-07-11 LAB — BASIC METABOLIC PANEL
Chloride: 101 mEq/L (ref 96–112)
GFR calc Af Amer: >60 mL/min (ref >60)
GFR calc non Af Amer: >60 mL/min (ref >60)
Potassium: 4.5 mEq/L (ref 3.5–5.1)
Sodium: 138 mEq/L (ref 135–145)

## 2011-07-12 LAB — CBC
HCT: 34.6 % — ABNORMAL LOW (ref 36.0–46.0)
Hemoglobin: 11.1 g/dL — ABNORMAL LOW (ref 12.0–15.0)
RBC: 3.71 MIL/uL — ABNORMAL LOW (ref 3.87–5.11)
RDW: 14.2 % (ref 11.5–15.5)
WBC: 6.8 10*3/uL (ref 4.0–10.5)

## 2011-07-12 LAB — BASIC METABOLIC PANEL
CO2: 27 mEq/L (ref 19–32)
Chloride: 100 mEq/L (ref 96–112)
Creatinine, Ser: 0.56 mg/dL (ref 0.50–1.10)
GFR calc Af Amer: >60 mL/min (ref >60)
Potassium: 3.7 mEq/L (ref 3.5–5.1)
Sodium: 137 mEq/L (ref 135–145)

## 2011-07-13 LAB — COMPREHENSIVE METABOLIC PANEL
ALT: 16 U/L (ref 0–35)
Alkaline Phosphatase: 96 U/L (ref 39–117)
BUN: 3 mg/dL — ABNORMAL LOW (ref 6–23)
CO2: 26 mEq/L (ref 19–32)
Calcium: 8.4 mg/dL (ref 8.4–10.5)
GFR calc Af Amer: >60 mL/min (ref >60)
GFR calc non Af Amer: >60 mL/min (ref >60)
Glucose, Bld: 113 mg/dL — ABNORMAL HIGH (ref 70–99)
Sodium: 135 mEq/L (ref 135–145)

## 2011-07-13 LAB — CBC
HCT: 35.5 % — ABNORMAL LOW (ref 36.0–46.0)
Hemoglobin: 11.3 g/dL — ABNORMAL LOW (ref 12.0–15.0)
MCH: 30.3 pg (ref 26.0–34.0)
RBC: 3.73 MIL/uL — ABNORMAL LOW (ref 3.87–5.11)

## 2011-07-14 ENCOUNTER — Encounter (HOSPITAL_COMMUNITY): Payer: Self-pay | Admitting: Radiology

## 2011-07-14 ENCOUNTER — Inpatient Hospital Stay (HOSPITAL_COMMUNITY): Payer: Managed Care, Other (non HMO)

## 2011-07-14 MED ORDER — IOHEXOL 300 MG/ML  SOLN
100.0000 mL | Freq: Once | INTRAMUSCULAR | Status: AC | PRN
  Administered 2011-07-14: 100 mL via INTRAVENOUS

## 2011-07-15 LAB — CBC
MCH: 30.7 pg (ref 26.0–34.0)
MCV: 94.3 fL (ref 78.0–100.0)
Platelets: 247 10*3/uL (ref 150–400)
RDW: 14.1 % (ref 11.5–15.5)
WBC: 6.8 10*3/uL (ref 4.0–10.5)

## 2011-07-15 LAB — BASIC METABOLIC PANEL
Calcium: 8.4 mg/dL (ref 8.4–10.5)
Creatinine, Ser: 0.53 mg/dL (ref 0.50–1.10)
GFR calc Af Amer: >60 mL/min (ref >60)

## 2011-07-24 ENCOUNTER — Ambulatory Visit: Payer: Managed Care, Other (non HMO) | Attending: Gynecology | Admitting: Gynecology

## 2011-07-24 DIAGNOSIS — C549 Malignant neoplasm of corpus uteri, unspecified: Secondary | ICD-10-CM | POA: Insufficient documentation

## 2011-07-24 DIAGNOSIS — Z9889 Other specified postprocedural states: Secondary | ICD-10-CM | POA: Insufficient documentation

## 2011-07-27 NOTE — Consult Note (Signed)
  NAME:  [name] [name], [name] [name]               ACCOUNT NO.:  [id number]  MEDICAL RECORD NO.:  [id number]  LOCATION:  GYN                          FACILITY:  [facility]  PHYSICIAN:  De Blanch, M.D.DATE OF BIRTH:  06/11/1956  DATE OF CONSULTATION: DATE OF DISCHARGE:                                CONSULTATION   CHIEF COMPLAINT:  Postoperative followup.  INTERVAL HISTORY:  The patient underwent total abdominal hysterectomy, bilateral salpingo-oophorectomy on September 18th.  Final pathology showed a poorly differentiated squamous cell carcinoma involving the surface of the endometrial cavity.  There was no evidence of myometrial invasion and the cervix was normal.  Adnexa and washings were normal and pelvic lymph nodes had no evidence of metastatic disease.  The patient had a slightly prolonged postoperative course because of ileus.  Today she presents to review her pathology and me check her wound.  PHYSICAL EXAMINATION:  VITAL SIGNS:  Weight 210 pounds, blood pressure 118/68. ABDOMEN:  Soft and nontender.  She has some ecchymoses from her Lovenox injections.  There is approximately a 3 x 1 cm area of superficial wound separation, which is granulating well.  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  Given the other good prognostic features, I do not believe any additional therapy would be recommended.  The patient will continue to recover from surgery and I will plan on seeing her back at an approximate 6-week postoperative check.  At this juncture, she will most likely be ready to return to work on October 30th.     De Blanch, M.D.     DC/MEDQ  D:  07/24/2011  T:  07/25/2011  Job:  [id number]  cc:   Telford Nab, R.N. [address] [place], [place] [address]  Horton Chin, MD  Electronically Signed by De Blanch M.D. on 07/27/2011 09:11:22 AM

## 2011-08-19 ENCOUNTER — Ambulatory Visit: Payer: Managed Care, Other (non HMO) | Attending: Gynecology | Admitting: Gynecology

## 2011-08-19 DIAGNOSIS — Z90710 Acquired absence of both cervix and uterus: Secondary | ICD-10-CM | POA: Insufficient documentation

## 2011-08-19 DIAGNOSIS — E669 Obesity, unspecified: Secondary | ICD-10-CM | POA: Insufficient documentation

## 2011-08-19 DIAGNOSIS — Z9079 Acquired absence of other genital organ(s): Secondary | ICD-10-CM | POA: Insufficient documentation

## 2011-08-19 DIAGNOSIS — F17200 Nicotine dependence, unspecified, uncomplicated: Secondary | ICD-10-CM | POA: Insufficient documentation

## 2011-08-19 DIAGNOSIS — C549 Malignant neoplasm of corpus uteri, unspecified: Secondary | ICD-10-CM | POA: Insufficient documentation

## 2011-08-21 NOTE — Consult Note (Signed)
  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  INTERVAL HISTORY:  The patient returns for her first postoperative visit now approximately 6 weeks since [facility] on pelvic lymphadenectomy.  She has had an uncomplicated postoperative course and was returned to work about a week ago.  She denies any GI or GU symptoms, has no pelvic pain, pressure, vaginal bleeding, or discharge.  HISTORY OF PRESENT ILLNESS:  Patient initially presented with postmenopausal bleeding and a high-grade squamous lesion, initially thought to possibly represent a cervical cancer.  However, following the cold knife conization which was negative, we performed an abdominal hysterectomy and found a squamous cell carcinoma of the endometrium. There was no invasion and lymph nodes and washings were negative.  MEDICAL ILLNESSES:  Obesity.  PAST SURGICAL HISTORY:  Laparoscopic cholecystectomy, cold knife conization, TAHBSO, pelvic lymphadenectomy.  CURRENT MEDICATIONS:  None.  DRUG ALLERGIES:  None.  FAMILY HISTORY:  Negative for gynecologic, breast, or colon cancer.  SOCIAL HISTORY:  The patient is divorced.  She smokes a half-pack per day.  REVIEW OF SYSTEMS:  Ten-point comprehensive review of systems negative except as noted above.  PHYSICAL EXAMINATION:  VITAL SIGNS:  Weight 210 pounds.  Remainder of vital signs are in the patient's chart. ABDOMEN:  Soft and nontender.  Midline incision is well-healed. PELVIC EXAM:  EGBUS, vagina, bladder, urethra are normal.  Cervix and uterus surgically absent.  Vaginal cuff is well-healed.  Bimanual exam reveals no masses or  nodularity.  IMPRESSION:  Stage IA, grade 1 squamous cell carcinoma of the endometrium.  The patient has had a good postoperative recovery and is given the okay to return to full levels of activity.  She will return to see me in 6 months for continued surveillance.     De Blanch, M.D.     DC/MEDQ  D:  08/19/2011  T:  08/19/2011  Job:  [id number]  cc:   Horton Chin, MD  Telford Nab, R.N. [address] [place], [place] [address]  Electronically Signed by De Blanch M.D. on 08/21/2011 09:38:00 AM

## 2011-08-26 NOTE — Discharge Summary (Signed)
  NAME:  [name] [name], [name] [name]               ACCOUNT NO.:  [id number]  MEDICAL RECORD NO.:  [id number]  LOCATION:  1527                         FACILITY:  [facility]  PHYSICIAN:  Roseanna Rainbow, M.D.DATE OF BIRTH:  02/23/1956  DATE OF ADMISSION:  07/07/2011 DATE OF DISCHARGE:  07/15/2011                              DISCHARGE SUMMARY   CHIEF COMPLAINT:  The patient is a 55 year old with a squamous cell carcinoma of endometrium, who presents for hysterectomy with staging. Please see the dictated history and physical.  HOSPITAL COURSE:  The patient was admitted and underwent a total abdominal hysterectomy, bilateral salpingo-oophorectomy, and pelvic lymphadenectomy.  Please see the dictated operative summary for findings.  On postoperative day #1, a hemoglobin was 11.8, a basic metabolic profile was normal.  The patient was started on liquid diet.  The PCA was discontinued.  The IV was saline locked and the patient was started on Lovenox for thromboembolic prophylaxis.  On the postoperative day #2, the patient complained of a small amount of nausea and her diet was advanced.  On postoperative #3, she was felt to be tolerating her diet and was encouraged to ambulate.  On postoperative day #3, the patient had an episode of nausea.  She was given Dulcolax suppositories.  Labs on September 22 were normal.  An abdominal x-ray had findings consistent with a mild ileus.  She was started on a clear liquid diet and IV fluids were resumed.  She was placed on sips of liquids.  A nutrition consult was obtained.  A CT scan of the abdomen and pelvis was normal.  The patient's diet was gradually advanced.  She was tolerating a regular diet on the day of discharge.  DISCHARGE DIAGNOSIS:  Squamous cell carcinoma of the endometrium,  postoperative ileus.  PROCEDURE: 1. Total abdominal hysterectomy. 2. Bilateral salpingo-oophorectomy. 3. Pelvic lymphadenectomy.  CONDITION:  Stable.  DIET:   Regular.  ACTIVITY:  Pelvic rest, progressive activity.  MEDICATIONS: 1. Lovenox 40 mg subcutaneous daily. 2. Percocet 5/325 one to two tablets every 6 hours as needed. 3. Advil 200 mg three to four tablets every 6 hours as needed.  DISPOSITION:  The patient is to follow up in the GYN Oncology office.     Roseanna Rainbow, M.D.     [name] [name]  D:  07/29/2011  T:  07/30/2011  Job:  [id number]  Electronically Signed by Antionette Char M.D. on 08/26/2011 09:10:37 PM

## 2011-09-18 MED ORDER — FUROSEMIDE 10 MG/ML IJ SOLN
INTRAMUSCULAR | Status: AC
  Filled 2011-09-18: qty 8

## 2012-01-22 ENCOUNTER — Encounter: Payer: Self-pay | Admitting: Gynecologic Oncology

## 2012-01-25 ENCOUNTER — Encounter: Payer: Self-pay | Admitting: Gynecology

## 2012-01-25 ENCOUNTER — Ambulatory Visit: Payer: Managed Care, Other (non HMO) | Attending: Gynecology | Admitting: Gynecology

## 2012-01-25 ENCOUNTER — Other Ambulatory Visit (HOSPITAL_COMMUNITY)
Admission: RE | Admit: 2012-01-25 | Discharge: 2012-01-25 | Disposition: A | Discharge status: Home / Self Care | Payer: Managed Care, Other (non HMO) | Source: Ambulatory Visit | Attending: Gynecology | Admitting: Gynecology

## 2012-01-25 VITALS — BP 118/68 | HR 70 | Temp 98.4°F | Resp 22 | Ht 63.0 in | Wt 210.4 lb

## 2012-01-25 DIAGNOSIS — C549 Malignant neoplasm of corpus uteri, unspecified: Secondary | ICD-10-CM | POA: Insufficient documentation

## 2012-01-25 DIAGNOSIS — Z01419 Encounter for gynecological examination (general) (routine) without abnormal findings: Secondary | ICD-10-CM | POA: Insufficient documentation

## 2012-01-25 DIAGNOSIS — C541 Malignant neoplasm of endometrium: Secondary | ICD-10-CM

## 2012-01-25 DIAGNOSIS — Z9079 Acquired absence of other genital organ(s): Secondary | ICD-10-CM | POA: Insufficient documentation

## 2012-01-25 DIAGNOSIS — F17200 Nicotine dependence, unspecified, uncomplicated: Secondary | ICD-10-CM | POA: Insufficient documentation

## 2012-01-25 DIAGNOSIS — Z90710 Acquired absence of both cervix and uterus: Secondary | ICD-10-CM | POA: Insufficient documentation

## 2012-01-25 NOTE — Patient Instructions (Signed)
Schedule return appointment in 6 months. Remember to obtain her mammograms in June. We will contact you if her Pap smear report

## 2012-01-25 NOTE — Progress Notes (Signed)
Consult Note: Gyn-Onc   [name] [name] 56 y.o. female  Chief Complaint  Patient presents with  . Endometrial Cancer    Follow up    Interval History: Patient returns today for scheduled followup. Since her last visit she's done well she denies any GI or GU symptoms. Her functional status is excellent. She is scheduled to have mammograms in June. She has no other complaints.  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 She had an uncomplicated postoperative course. Pathology was favorable and required no additional treatment. Allergies  Allergen Reactions  . Codeine     Past Medical History  Diagnosis Date  . Cancer     endometrial ca    Past Surgical History  Procedure Date  . Cholecystectomy   . Cervical cone biopsy   . Abdominal hysterectomy     TAH, BSO, pelvic lymphadenectomy    No current outpatient prescriptions on file.    History   Social History  . Marital Status: Divorced    Spouse Name: N/A    Number of Children: N/A  . Years of Education: N/A   Occupational History  . Not on file.   Social History Main Topics  . Smoking status: Current Everyday Smoker    Types: Cigarettes  . Smokeless tobacco: Not on file  . Alcohol Use: Not on file  . Drug Use: Not on file  . Sexually Active: Not on file   Other Topics Concern  . Not on file   Social History Narrative  . No narrative on file    No family history on file.  Review of Systems: 10 point review of systems is negative except as noted above  Vitals: Blood pressure 118/68, pulse 70, temperature 98.4 F (36.9 C), temperature source Oral, resp. rate 22, height 5\' 3"  (1.6 m), weight 210 lb 6.4 oz (95.437 kg).  Physical Exam: In general the patient is a healthy [name] [name] female no acute distress  HEENT is negative  Neck is supple without thyromegaly  Is no subclavicular or inguinal adenopathy  Pelvic exam EGBUS is  normal vagina is atrophic. The vaginal cuff is well-healed no lesions are noted.  Cervix and uterus are surgically absent  Bimanual exam reveals no masses induration or nodularity  Rectovaginal exam confirmed Assessment/Plan: Stage IA grade 1 squamous cell carcinoma of the endometrium. No evidence of disease. Pap smears are obtained. The patient returned to see me in 6 months for continued followup  CLARKE-PEARSON,Marquett Bertoli L, MD 01/25/2012, 12:50 PM

## 2012-01-25 NOTE — Progress Notes (Signed)
Addended by: Warner Mccreedy D on: 01/25/2012 02:10 PM   Modules accepted: Orders

## 2012-02-11 ENCOUNTER — Telehealth: Payer: Self-pay | Admitting: Gynecologic Oncology

## 2012-02-11 NOTE — Telephone Encounter (Signed)
Message left for patient with pap smear results: negative.  Instructed to call for any questions or concerns.  

## 2012-04-27 ENCOUNTER — Other Ambulatory Visit: Payer: Self-pay | Admitting: Gynecology

## 2012-04-27 DIAGNOSIS — Z1231 Encounter for screening mammogram for malignant neoplasm of breast: Secondary | ICD-10-CM

## 2012-05-18 ENCOUNTER — Ambulatory Visit (HOSPITAL_COMMUNITY)
Admission: RE | Admit: 2012-05-18 | Discharge: 2012-05-18 | Disposition: A | Discharge status: Home / Self Care | Payer: Managed Care, Other (non HMO) | Source: Ambulatory Visit | Attending: Gynecology | Admitting: Gynecology

## 2012-05-18 DIAGNOSIS — Z1231 Encounter for screening mammogram for malignant neoplasm of breast: Secondary | ICD-10-CM | POA: Insufficient documentation

## 2012-08-03 ENCOUNTER — Ambulatory Visit: Payer: Managed Care, Other (non HMO) | Attending: Gynecology | Admitting: Gynecology

## 2012-08-03 ENCOUNTER — Other Ambulatory Visit (HOSPITAL_COMMUNITY)
Admission: RE | Admit: 2012-08-03 | Discharge: 2012-08-03 | Disposition: A | Discharge status: Home / Self Care | Payer: Managed Care, Other (non HMO) | Source: Ambulatory Visit | Attending: Gynecology | Admitting: Gynecology

## 2012-08-03 ENCOUNTER — Encounter: Payer: Self-pay | Admitting: Gynecology

## 2012-08-03 VITALS — BP 118/72 | HR 70 | Temp 98.5°F | Resp 20 | Ht 63.0 in | Wt 207.5 lb

## 2012-08-03 DIAGNOSIS — C549 Malignant neoplasm of corpus uteri, unspecified: Secondary | ICD-10-CM | POA: Insufficient documentation

## 2012-08-03 DIAGNOSIS — Z9079 Acquired absence of other genital organ(s): Secondary | ICD-10-CM | POA: Insufficient documentation

## 2012-08-03 DIAGNOSIS — Z01419 Encounter for gynecological examination (general) (routine) without abnormal findings: Secondary | ICD-10-CM | POA: Insufficient documentation

## 2012-08-03 DIAGNOSIS — C541 Malignant neoplasm of endometrium: Secondary | ICD-10-CM

## 2012-08-03 DIAGNOSIS — F17200 Nicotine dependence, unspecified, uncomplicated: Secondary | ICD-10-CM | POA: Insufficient documentation

## 2012-08-03 DIAGNOSIS — Z90710 Acquired absence of both cervix and uterus: Secondary | ICD-10-CM | POA: Insufficient documentation

## 2012-08-03 NOTE — Patient Instructions (Signed)
We will contact you if the Pap smear report. Please scheduled to return

## 2012-08-03 NOTE — Progress Notes (Signed)
Consult Note: Gyn-Onc   [name] [name] 56 y.o. female  Chief Complaint  Patient presents with  . Endometrial cancer    Follow up    Interval History: The patient returns today for scheduled followup. Since her last visit she's done well. She denies any GI or GU symptoms has no pelvic pain pressure vaginal bleeding or discharge. Her functional status is excellent.  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 Patient underwent TAH/BSO and pelvic lymphadenectomy in September 2012. Pathology was favorable and she did not receive any adjuvant therapy.  Review of Systems:10 point review of systems is negative as noted above.   Vitals: Blood pressure 118/72, pulse 70, temperature 98.5 F (36.9 C), temperature source Oral, resp. rate 20, height 5\' 3"  (1.6 m), weight 207 lb 8 oz (94.121 kg).  Physical Exam: General : The patient is a healthy woman in no acute distress.  HEENT: normocephalic, extraoccular movements normal; neck is supple without thyromegally  Lynphnodes: Supraclavicular and inguinal nodes not enlarged  Abdomen: Soft, non-tender, no ascites, no organomegally, no masses, no hernias  Pelvic:  EGBUS: Normal female  Vagina: Normal, no lesions  Urethra and Bladder: Normal, non-tender  Cervix: Surgically absent  Uterus: Surgically absent  Bi-manual examination: Non-tender; no adenxal masses or nodularity  Rectal: normal sphincter tone, no masses, no blood  Lower extremities: No edema or varicosities. 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 September 2012. Clinically free of disease  Pap smears are obtained  Patient return to see me in 6 months.  Allergies  Allergen Reactions  . Codeine     Past Medical History  Diagnosis Date  . Cancer     endometrial ca    Past Surgical History  Procedure Date  . Cholecystectomy   . Cervical cone biopsy   . Abdominal hysterectomy     TAH, BSO, pelvic  lymphadenectomy    Current Outpatient Prescriptions  Medication Sig Dispense Refill  . Ibuprofen (ADVIL) 200 MG CAPS Take by mouth as needed.      . Naproxen Sodium (ALEVE PO) Take 1 tablet by mouth as needed.      Gerarda Fraction Root 250 MG CAPS Take 4 capsules by mouth 2 (two) times daily.        History   Social History  . Marital Status: Divorced    Spouse Name: N/A    Number of Children: N/A  . Years of Education: N/A   Occupational History  . Not on file.   Social History Main Topics  . Smoking status: Current Every Day Smoker    Types: Cigarettes  . Smokeless tobacco: Not on file   Comment: smoking cessation information given  . Alcohol Use: Yes     rarely  . Drug Use: No  . Sexually Active: No   Other Topics Concern  . Not on file   Social History Narrative  . No narrative on file    No family history on file.    Jeannette Corpus, MD 08/03/2012, 3:30 PM

## 2012-08-03 NOTE — Addendum Note (Signed)
Addended by: Abram Sander on: 08/03/2012 04:00 PM   Modules accepted: Orders

## 2012-08-11 ENCOUNTER — Telehealth: Payer: Self-pay | Admitting: Gynecologic Oncology

## 2012-08-11 NOTE — Telephone Encounter (Signed)
Pt notified about pap results: negative.  No questions or concerns voiced. 

## 2012-12-08 IMAGING — CT CT ABD-PELV W/ CM
1 of 3 series · 13 of 32 positions shown, 19 images · IV contrast (OMNIPAQUE)
Comparison: Pelvic ultrasound 03/05/2011

CLINICAL DATA: Recently diagnosed uterine carcinoma.  Hematometros.

CT ABDOMEN AND PELVIS WITH CONTRAST
TECHNIQUE: Multidetector CT imaging of the abdomen and pelvis was
performed following the standard protocol during bolus
administration of intravenous contrast.
Contrast: 100 ml Xmnipaque-A44 and oral contrast

[Series 2: routine abdomen/pelvis with · axial · 0.84mm/px · z∈[-574,-139]mm · 13 of 101 slices shown, 19 images]
[im 7/101  soft-tissue]
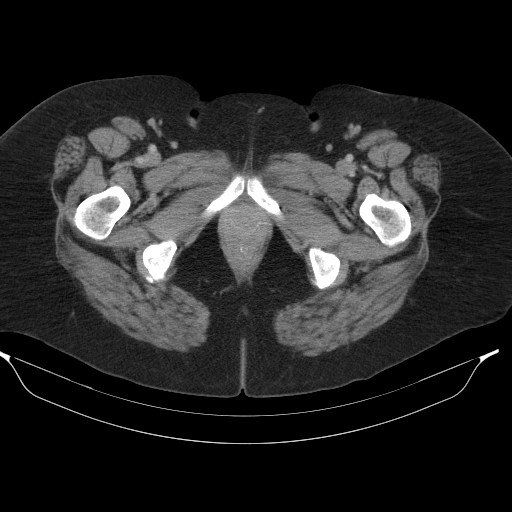
[im 7/101  bone]
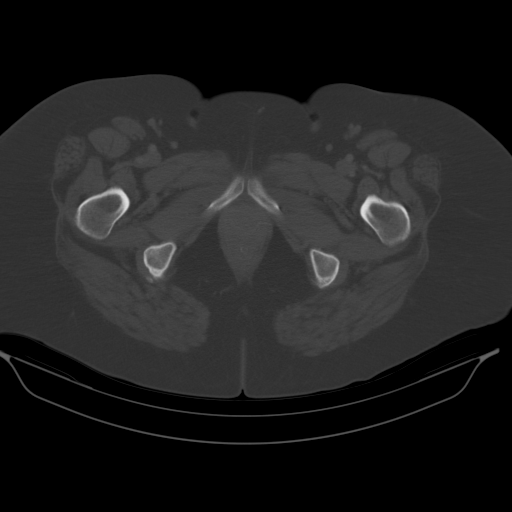
[im 14/101  soft-tissue]
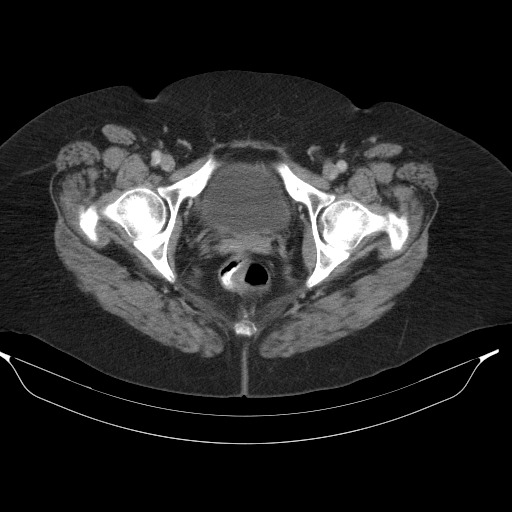
[im 21/101  soft-tissue]
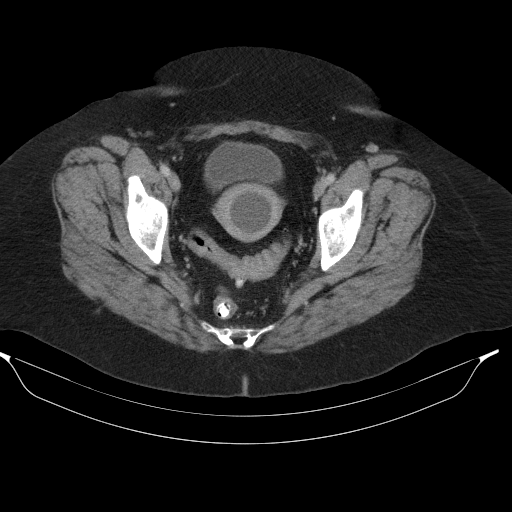
[im 27/101  soft-tissue]
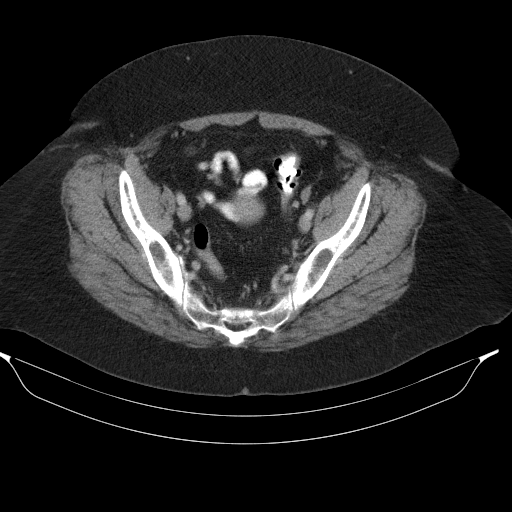
[im 34/101  soft-tissue]
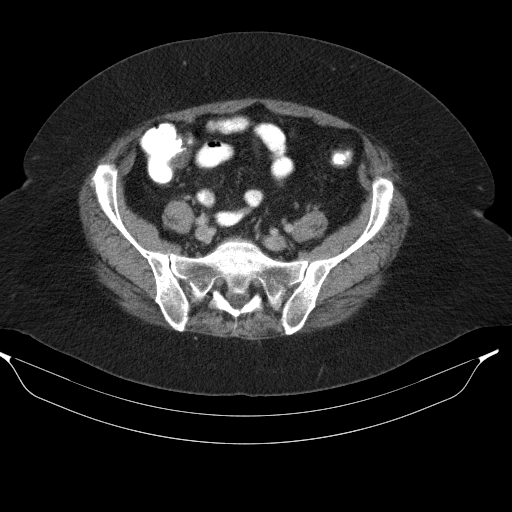
[im 41/101  soft-tissue]
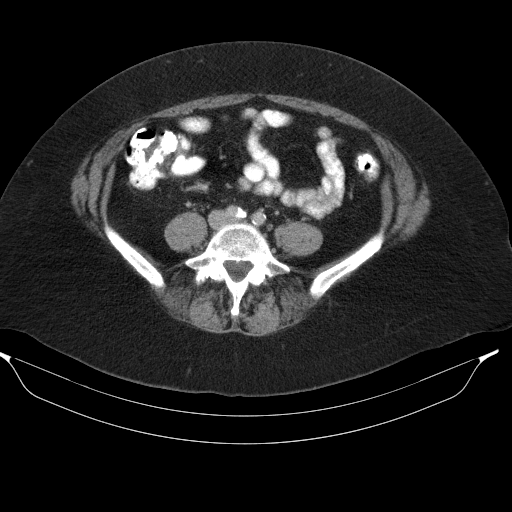
[im 54/101  soft-tissue]
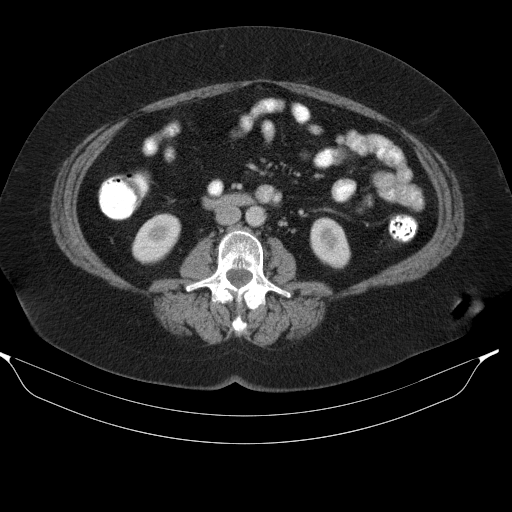
[im 61/101  soft-tissue]
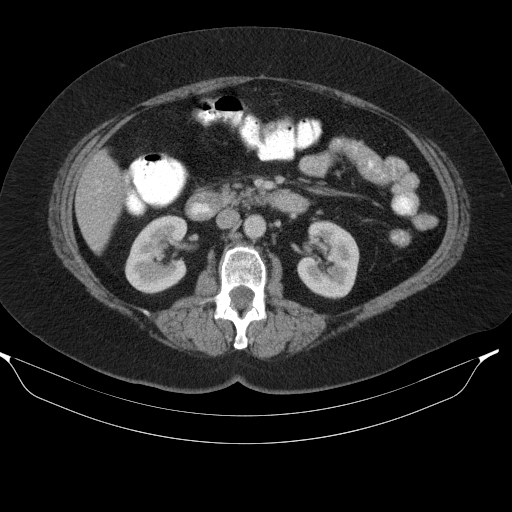
[im 67/101  soft-tissue]
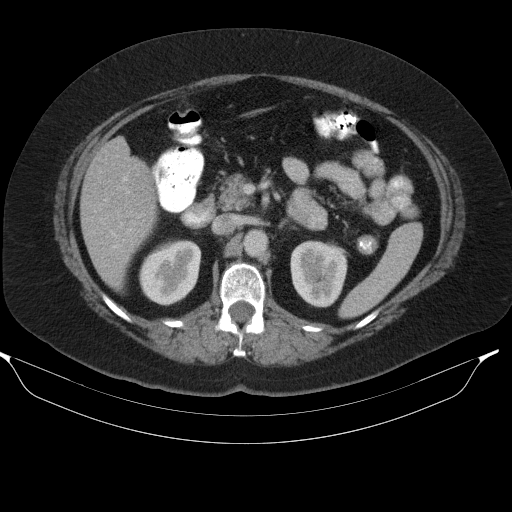
[im 67/101  bone]
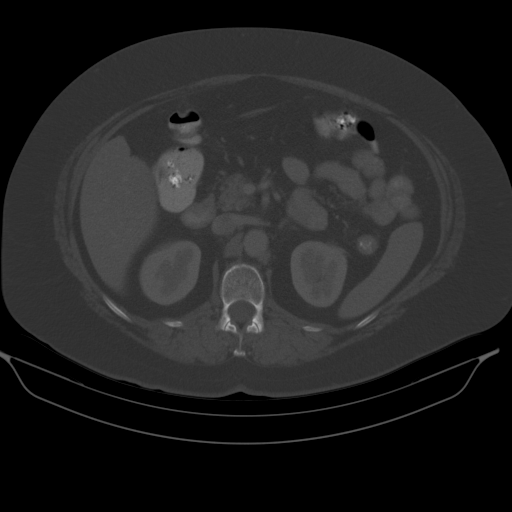
[im 74/101  soft-tissue]
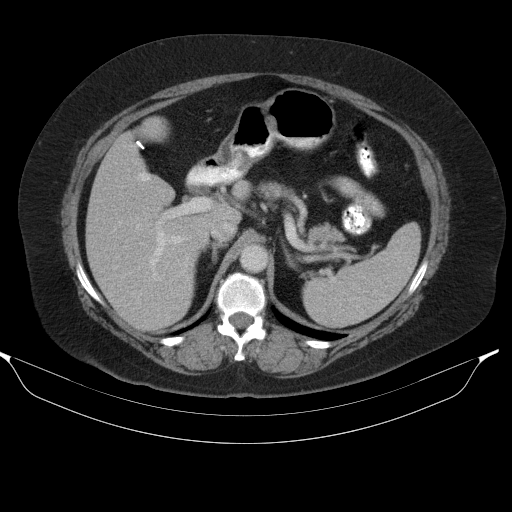
[im 74/101  lung]
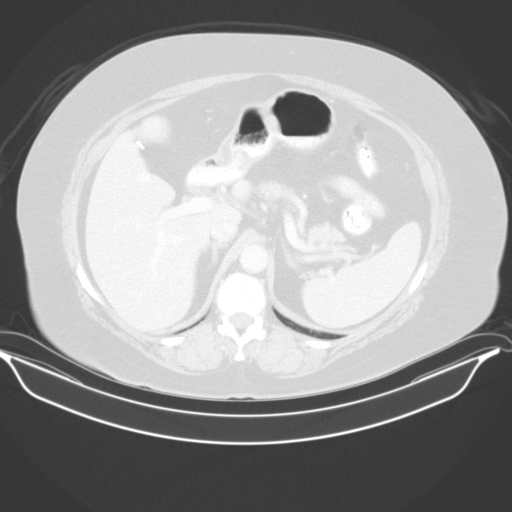
[im 81/101  soft-tissue]
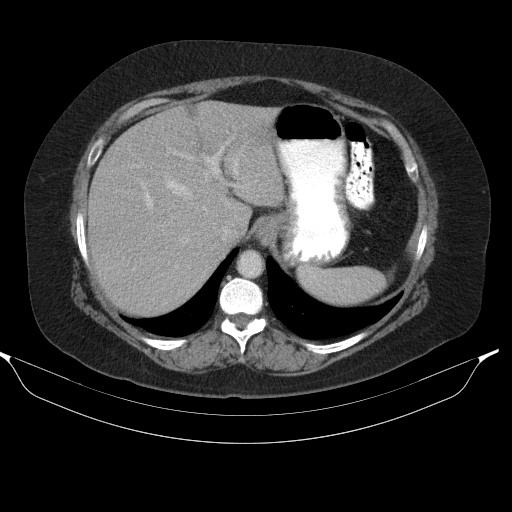
[im 81/101  lung]
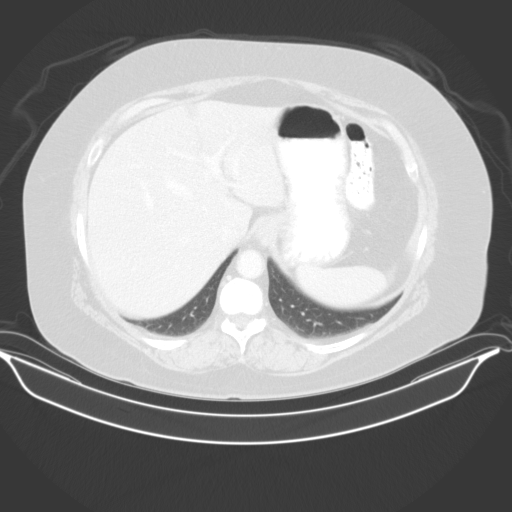
[im 87/101  soft-tissue]
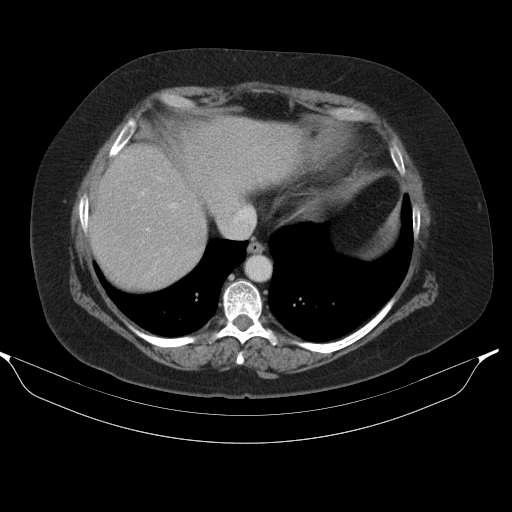
[im 87/101  lung]
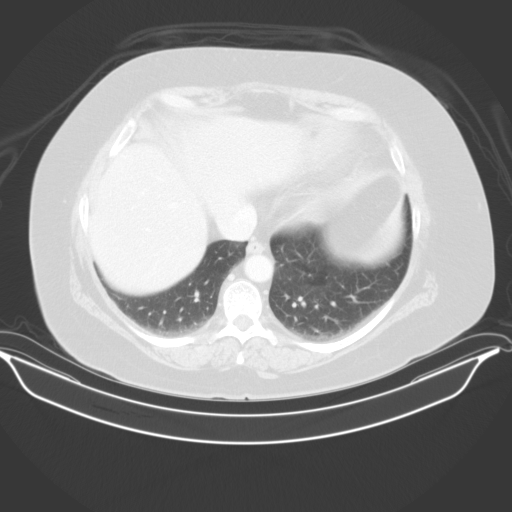
[im 94/101  soft-tissue]
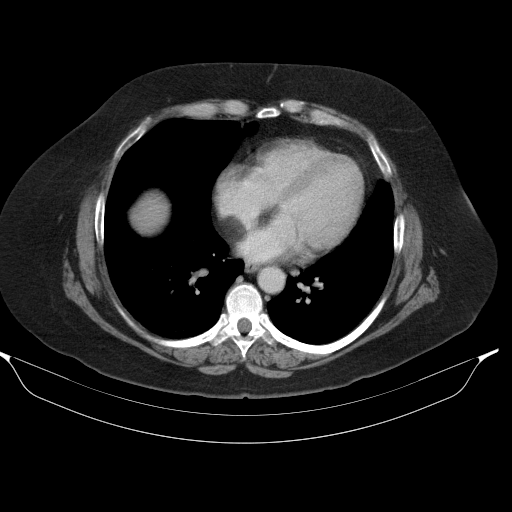
[im 94/101  lung]
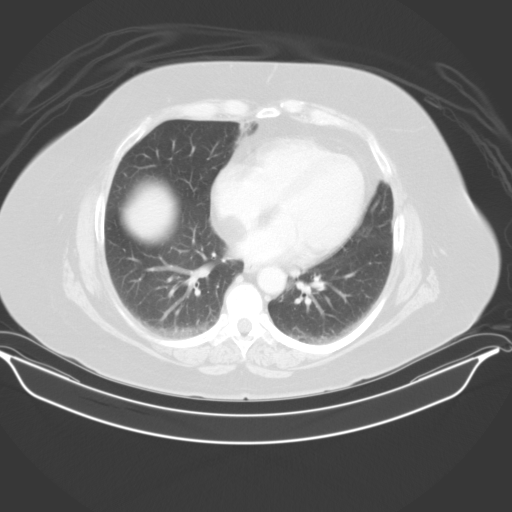

[13 of 32 positions shown; findings below may reference images not displayed]

FINDINGS: A central fluid collection is seen within the uterus,
consistent with hematometros, as shown on recent pelvic ultrasound.
Although no bulky cervical mass is visualized, a small obstructing
cervical carcinoma cannot be excluded by CT.

Vagina is unremarkable in appearance.  No definite masses are seen
in the parametria or along the pelvic sidewalls. Small post
menopausal ovaries and adnexal regions appear normal.

There is no evidence of pelvic or retroperitoneal lymphadenopathy.
No other abdominal soft tissue masses or lymphadenopathy
identified.

There is no evidence of hydronephrosis.  Both kidneys are normal in
appearance.  The liver, spleen, pancreas, and adrenal glands are
also normal in appearance.  Previous cholecystectomy noted.  No
evidence of ascites, inflammatory process, or abscess.
Diverticulosis of the sigmoid colon is seen, however there is no
evidence of diverticulitis.

Visualized portions of the lung bases are clear.  No suspicious
bone lesions are identified.
IMPRESSION: 1.  Hematometros.  A small cervical carcinoma cannot be excluded by
CT.  Preoperative pelvic MRI without and with contrast could be
performed for local staging if desired.

2.  No evidence of pelvic lymphadenopathy or distant metastatic
disease.
3.  Sigmoid diverticulosis.  No radiographic evidence of
diverticulitis.

## 2012-12-08 IMAGING — CR DG CHEST 2V
2 series · 2 of 2 positions shown · non-contrast
Comparison: 08/27/2006

CLINICAL DATA: G Y malignancy - new diagnosis

CHEST - 2 VIEW

[view not recorded (1 of 2)]
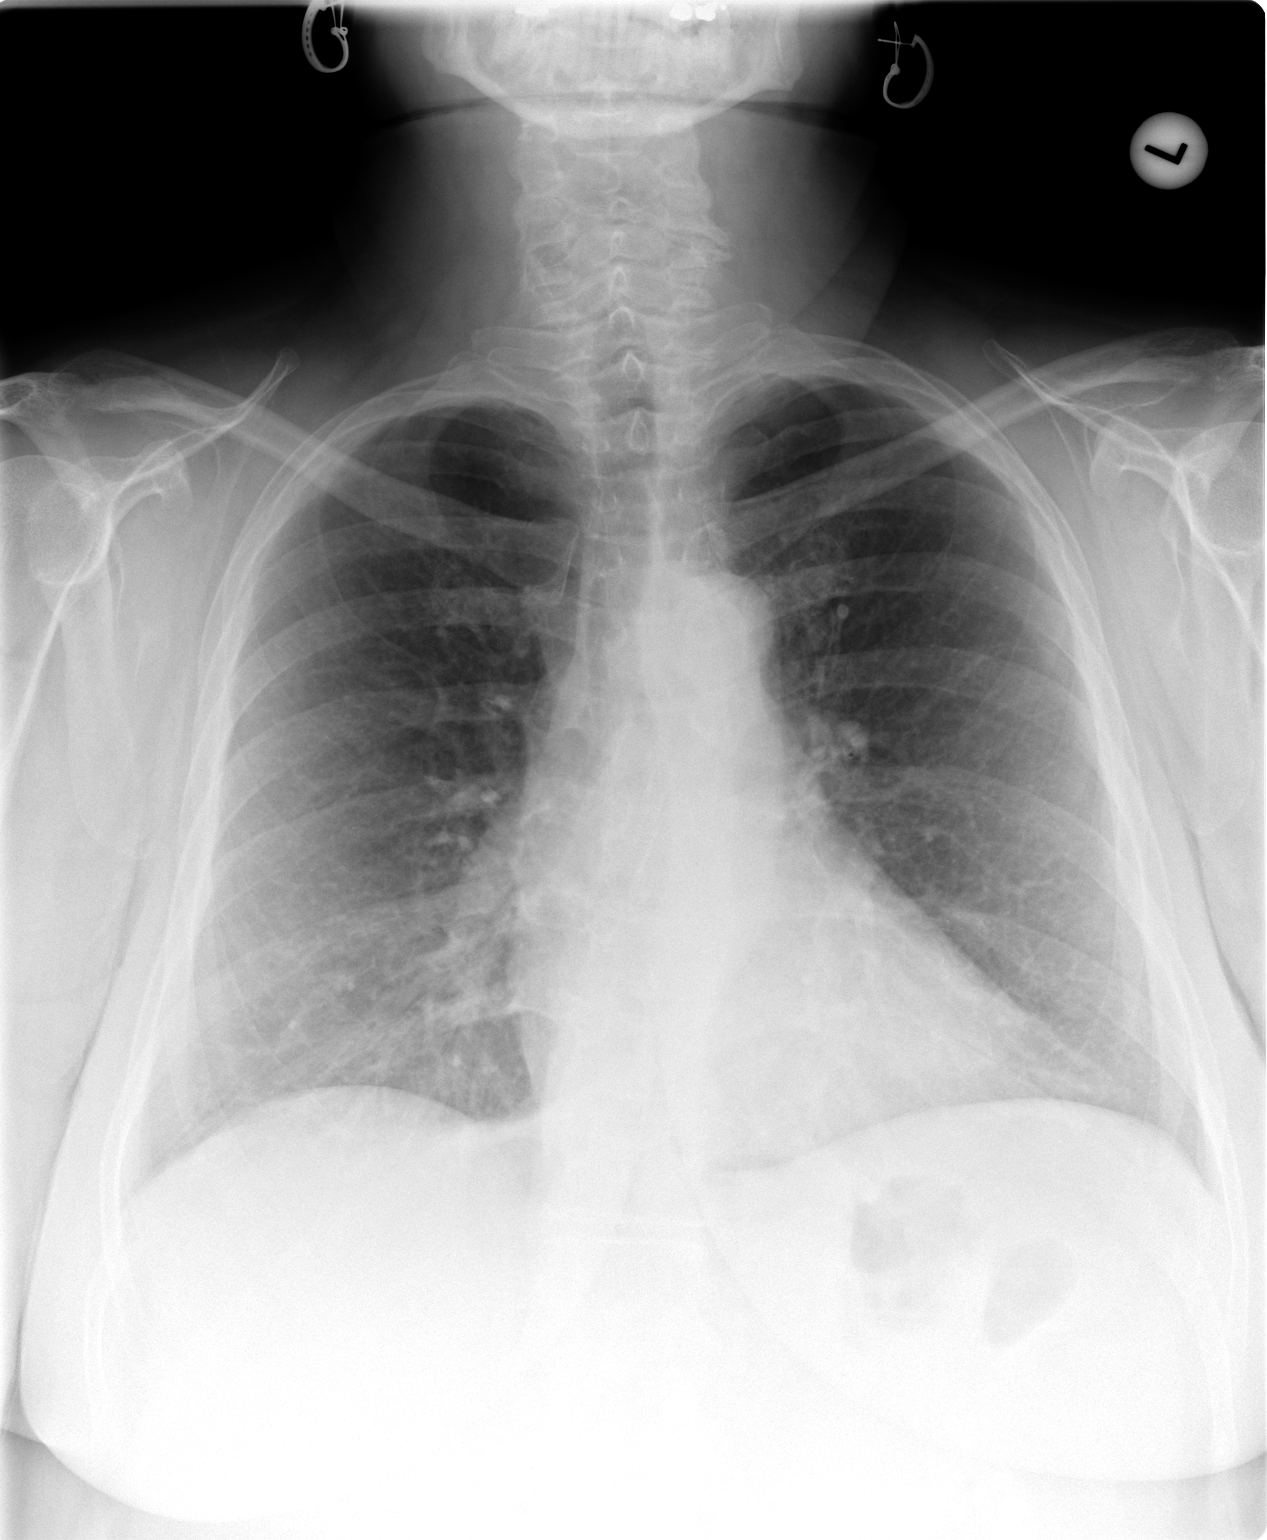

[view not recorded (2 of 2)]
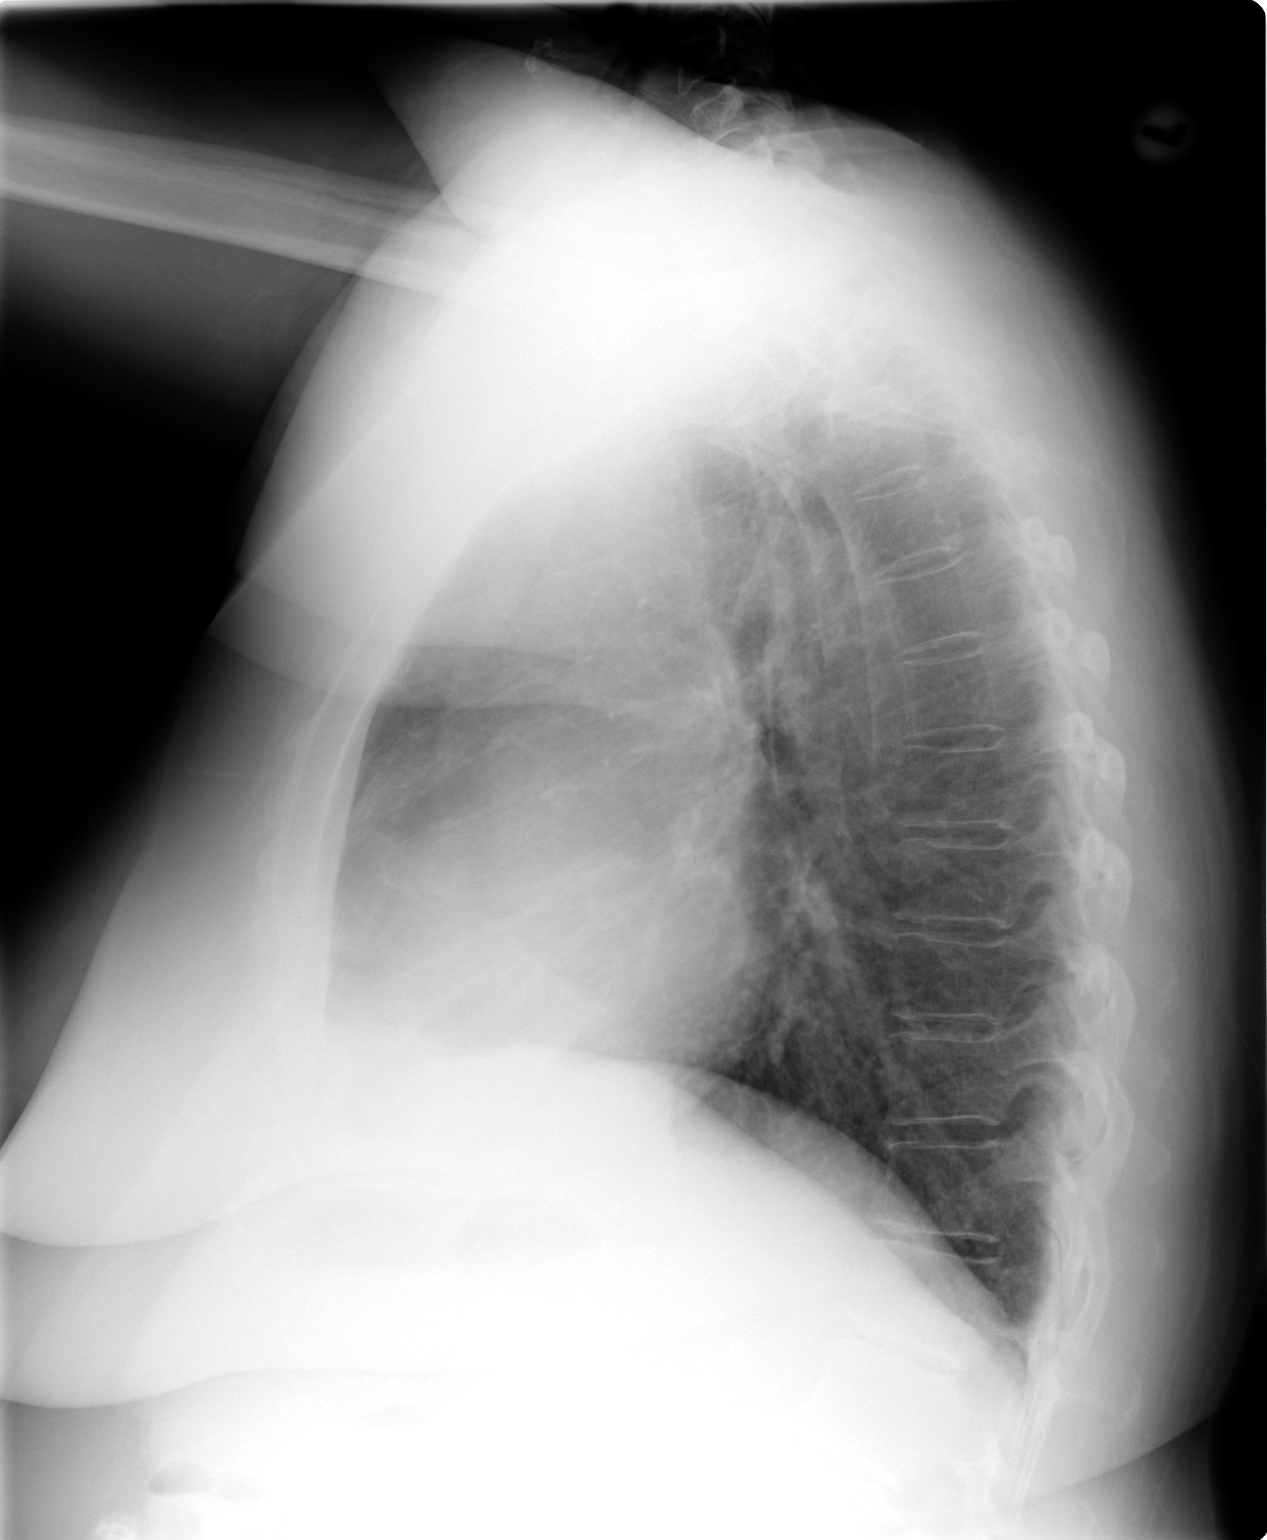

[2 of 2 positions shown; findings below may reference images not displayed]

FINDINGS: Heart size of the normal.  No vascular congestion or
pleural fluid.  Mild central airway thickening without active
airspace disease.  Osseous structures intact.  Prior
cholecystectomy.
IMPRESSION: No active disease.

## 2013-02-06 ENCOUNTER — Encounter: Payer: Self-pay | Admitting: Gynecology

## 2013-02-06 ENCOUNTER — Encounter: Payer: Self-pay | Admitting: Gynecologic Oncology

## 2013-02-06 ENCOUNTER — Other Ambulatory Visit (HOSPITAL_COMMUNITY)
Admission: RE | Admit: 2013-02-06 | Discharge: 2013-02-06 | Disposition: A | Discharge status: Home / Self Care | Payer: Managed Care, Other (non HMO) | Source: Ambulatory Visit | Attending: Gynecology | Admitting: Gynecology

## 2013-02-06 ENCOUNTER — Ambulatory Visit: Payer: Managed Care, Other (non HMO) | Attending: Gynecology | Admitting: Gynecologic Oncology

## 2013-02-06 VITALS — BP 148/82 | HR 88 | Temp 97.9°F | Resp 22 | Ht 63.0 in | Wt 214.2 lb

## 2013-02-06 DIAGNOSIS — Z90710 Acquired absence of both cervix and uterus: Secondary | ICD-10-CM | POA: Insufficient documentation

## 2013-02-06 DIAGNOSIS — Z01419 Encounter for gynecological examination (general) (routine) without abnormal findings: Secondary | ICD-10-CM | POA: Insufficient documentation

## 2013-02-06 DIAGNOSIS — C549 Malignant neoplasm of corpus uteri, unspecified: Secondary | ICD-10-CM | POA: Insufficient documentation

## 2013-02-06 DIAGNOSIS — C541 Malignant neoplasm of endometrium: Secondary | ICD-10-CM

## 2013-02-06 NOTE — Patient Instructions (Addendum)
Doing well.  We will contact you with the results of your pap smear.  Plan to follow up with Dr. Stanford Breed in 6 months or sooner if needed.  Please call for any concerns.

## 2013-02-06 NOTE — Progress Notes (Signed)
Follow Up Note: Gyn-Onc  [name] [name] 57 y.o. female  CC:  Chief Complaint  Patient presents with  . 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  She underwent a TAH/BSO and pelvic lymphadenectomy on July 07, 2011 by Dr. Stanford Breed. Pathology was favorable and she did not receive any adjuvant therapy.  Interval History:  She presents today for continued follow up.  No concerns voiced since her last visit.  When asked if she has a primary care provider, she reports "not really."   Review of Systems  Constitutional: Feels well.  Cardiovascular: No chest pain, shortness of breath, or edema.  Pulmonary: Intermittent cough, which she relates to a "smoker's cough".  No wheeze. Gastrointestinal: No nausea, vomiting, or diarrhea. No bright red blood per rectum or change in bowel movement.  Genitourinary: No frequency, urgency, or dysuria. No vaginal bleeding or discharge.  Musculoskeletal: No myalgia or joint pain. Neurologic: No weakness, numbness, or change in gait.  Psychology: No depression, anxiety, or insomnia.  Health Maintenance: Mammogram:  Up to date per patient Pap Smear:  Last 07/2012 Colonoscopy:  Has had per patient  Current Meds:  Outpatient Encounter Prescriptions as of 02/06/2013  Medication Sig Dispense Refill  . Ibuprofen (ADVIL) 200 MG CAPS Take by mouth as needed.      Gerarda Fraction Root 250 MG CAPS Take 4 capsules by mouth 2 (two) times daily.      . Naproxen Sodium (ALEVE PO) Take 1 tablet by mouth as needed.       No facility-administered encounter medications on file as of 02/06/2013.    Allergy:  Allergies  Allergen Reactions  . Codeine     Social Hx:   History   Social History  . Marital Status: Divorced    Spouse Name: N/A    Number of Children: N/A  .  Years of Education: N/A   Occupational History  . Not on file.   Social History Main Topics  . Smoking status: Current Every Day Smoker -- 0.50 packs/day    Types: Cigarettes  . Smokeless tobacco: Not on file     Comment: smoking cessation information given  . Alcohol Use: Yes     Comment: rarely  . Drug Use: No  . Sexually Active: No   Other Topics Concern  . Not on file   Social History Narrative  . No narrative on file    Past Surgical Hx:  Past Surgical History  Procedure Laterality Date  . Cholecystectomy    . Cervical cone biopsy    . Abdominal hysterectomy      TAH, BSO, pelvic lymphadenectomy    Past Medical Hx:  Past Medical History  Diagnosis Date  . Cancer     endometrial ca    Family Hx: No family history on file.  Vitals:  Blood pressure 148/82, pulse 88, temperature 97.9 F (36.6 C), resp. rate 22, height 5\' 3"  (1.6 m), weight 214 lb 3.2 oz (97.16 kg).  Physical Exam:  General: Well developed, well nourished female in no acute distress. Alert and oriented x 3.  Neck: Supple without any enlargements.  Lymph node survey: No cervical, supraclavicular, or inguinal adenopathy  Cardiovascular: Regular rate and rhythm. S1 and S2 normal.  Lungs: Clear to auscultation bilaterally. No wheezes/crackles/rhonchi noted.  Skin: No rashes or lesions present. Back: No CVA tenderness.  Abdomen: Abdomen soft, non-tender and obese. Active bowel sounds in all quadrants. No evidence of a fluid wave or abdominal masses.  Genitourinary:    Vulva/vagina: Normal external female genitalia. No lesions.    Urethra: No lesions or masses    Vagina: Atrophic without any lesions. No palpable masses. No vaginal bleeding or drainage noted. Pap smear obtained. Rectal: Good tone, no masses, no cul de sac nodularity.  Extremities: No bilateral cyanosis, edema, or clubbing.   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  She underwent  a TAH/BSO and pelvic lymphadenectomy on July 07, 2011 by Dr. Stanford Breed.  She is clinically free of disease at this time.  We will contact her with the results of her pap smear from today.  She is to follow up in six months at [facility] or sooner if needed.  She is advised to call for any questions or concerns.  The patient was reviewed with Dr. Stanford Breed.    Lamount Bankson DEAL, NP 02/06/2013, 2:43 PM

## 2013-02-09 ENCOUNTER — Telehealth: Payer: Self-pay | Admitting: Gynecologic Oncology

## 2013-02-09 NOTE — Telephone Encounter (Signed)
Message left for patient with pap smear results: negative.  Instructed to call for any questions or concerns.  

## 2013-07-24 ENCOUNTER — Other Ambulatory Visit: Payer: Self-pay | Admitting: Gynecology

## 2013-07-24 DIAGNOSIS — Z1231 Encounter for screening mammogram for malignant neoplasm of breast: Secondary | ICD-10-CM

## 2013-08-11 ENCOUNTER — Ambulatory Visit: Payer: Managed Care, Other (non HMO) | Attending: Gynecology | Admitting: Gynecologic Oncology

## 2013-08-11 ENCOUNTER — Encounter: Payer: Self-pay | Admitting: Gynecologic Oncology

## 2013-08-11 ENCOUNTER — Other Ambulatory Visit (HOSPITAL_COMMUNITY)
Admission: RE | Admit: 2013-08-11 | Discharge: 2013-08-11 | Disposition: A | Discharge status: Home / Self Care | Payer: Managed Care, Other (non HMO) | Source: Ambulatory Visit | Attending: Gynecology | Admitting: Gynecology

## 2013-08-11 ENCOUNTER — Ambulatory Visit (HOSPITAL_COMMUNITY)
Admission: RE | Admit: 2013-08-11 | Discharge: 2013-08-11 | Disposition: A | Discharge status: Home / Self Care | Payer: Managed Care, Other (non HMO) | Source: Ambulatory Visit | Attending: Gynecology | Admitting: Gynecology

## 2013-08-11 VITALS — BP 142/80 | HR 72 | Temp 98.9°F | Resp 18 | Ht 63.0 in | Wt 202.4 lb

## 2013-08-11 DIAGNOSIS — C541 Malignant neoplasm of endometrium: Secondary | ICD-10-CM

## 2013-08-11 DIAGNOSIS — Z01419 Encounter for gynecological examination (general) (routine) without abnormal findings: Secondary | ICD-10-CM | POA: Insufficient documentation

## 2013-08-11 DIAGNOSIS — Z1231 Encounter for screening mammogram for malignant neoplasm of breast: Secondary | ICD-10-CM | POA: Insufficient documentation

## 2013-08-11 NOTE — Patient Instructions (Signed)
Doing great!  We will contact you with the results of your pap smear from today.  Plan to follow up in six months or sooner if needed.

## 2013-08-11 NOTE — Progress Notes (Addendum)
Follow Up Note: Gyn-Onc  [name] [name] 57 y.o. female  CC:  Chief Complaint  Patient presents with  . 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  She underwent a TAH/BSO and pelvic lymphadenectomy on July 07, 2011 by Dr. Stanford Breed. Pathology was favorable and she did not receive any adjuvant therapy.  Interval History:  She presents today for continued follow up.  She has been participating in a fitness program for one hour every day except Sunday.  She has been doing this since July and states she feels much better.  She has been making better choices with dietary selections.  She states that she is not ready to quit smoking at this time but plans to in the future.  No concerns voiced since her last visit.  She has received the flu vaccine for this fall and had a 3D mammogram today.  She has had a colonoscopy and is not sure of the date but it has been greater than five years.  She reports Darryll Capers as "kind of" her primary care provider.   Review of Systems  Constitutional: Feels well.  No fever, chills, early satiety, or unintentional weight loss or gain. Cardiovascular: No chest pain, shortness of breath, or edema.  Pulmonary:  No cough or wheeze. Gastrointestinal: No nausea, vomiting, or diarrhea. No bright red blood per rectum or change in bowel movement.  Genitourinary: No frequency, urgency, or dysuria. No vaginal bleeding or discharge.  Musculoskeletal: No myalgia or joint pain. Neurologic: No weakness, numbness, or change in gait.  Psychology: No depression, anxiety, or insomnia.  Health Maintenance: Mammogram:  08/11/13 Pap Smear:  Last 02/06/2013 Colonoscopy:  Has had per patient  Current Meds:  Outpatient Encounter Prescriptions as of 08/11/2013  Medication Sig Dispense Refill  . Ibuprofen (ADVIL) 200 MG CAPS Take by mouth as needed.      . magnesium oxide  (MAG-OX) 400 MG tablet Take 400 mg by mouth daily.      . Naproxen Sodium (ALEVE PO) Take 1 tablet by mouth as needed.      Gerarda Fraction Root 250 MG CAPS Take 4 capsules by mouth 2 (two) times daily.       No facility-administered encounter medications on file as of 08/11/2013.    Allergy:  Allergies  Allergen Reactions  . Codeine     Social Hx:   History   Social History  . Marital Status: Divorced    Spouse Name: N/A    Number of Children: N/A  . Years of Education: N/A   Occupational History  . Not on file.   Social History Main Topics  . Smoking status: Current Every Day Smoker -- 0.50 packs/day    Types: Cigarettes  . Smokeless tobacco: Not on file     Comment: smoking cessation information given  . Alcohol Use: Yes     Comment: rarely  . Drug Use: No  . Sexual Activity: No   Other Topics Concern  . Not on file   Social History Narrative  . No narrative on file    Past Surgical Hx:  Past Surgical History  Procedure Laterality Date  . Cholecystectomy    . Cervical cone biopsy    . Abdominal hysterectomy      TAH, BSO, pelvic lymphadenectomy    Past Medical Hx:  Past Medical History  Diagnosis  Date  . Cancer     endometrial ca    Family Hx: History reviewed. No pertinent family history.  Vitals:  Blood pressure 142/80, pulse 72, temperature 98.9 F (37.2 C), temperature source Oral, resp. rate 18, height 5\' 3"  (1.6 m), weight 202 lb 6.4 oz (91.808 kg).  Physical Exam:  General: Well developed, well nourished female in no acute distress. Alert and oriented x 3.  Head/Neck: Sclerae anicteric.  Oropharynx clear.  Supple without any enlargements.  Lymph node survey: No cervical, supraclavicular, axillary, or inguinal adenopathy.  Cardiovascular: Regular rate and rhythm. S1 and S2 normal.  Lungs: Clear to auscultation bilaterally. No wheezes/crackles/rhonchi noted.  Skin: No rashes or lesions present. Back: No CVA tenderness.  Abdomen: Abdomen soft,  non-tender and obese. Active bowel sounds in all quadrants. No evidence of a fluid wave or abdominal masses.  Breasts: Inspection negative with no nodularity, discharge, erythema, or masses noted bilaterally. Genitourinary:    Vulva/vagina: Normal external female genitalia. No lesions.    Urethra: No lesions or masses.    Vagina: Atrophic without any lesions. No palpable masses. No vaginal bleeding or drainage noted. Pap smear obtained. Rectal: Good tone, no masses, no cul de sac nodularity.  Extremities: No bilateral cyanosis, edema, or clubbing.   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  She underwent a TAH/BSO and pelvic lymphadenectomy on July 07, 2011 by Dr. Stanford Breed.  She is clinically free of disease at this time.  We will contact her with the results of her pap smear from today.  She is encouraged to continue her fitness classes along with efforts to begin smoking cessation.  She is to follow up in six months at [facility] or sooner if needed.  She is advised to call for any questions or concerns.  The patient was reviewed with Dr. Stanford Breed.    Rhyleigh Grassel DEAL, NP 08/11/2013, 10:39 AM

## 2013-08-16 ENCOUNTER — Telehealth: Payer: Self-pay | Admitting: Gynecologic Oncology

## 2013-08-16 NOTE — Telephone Encounter (Signed)
Message left for patient with pap smear results: negative.  Instructed to call for any questions or concerns.  

## 2014-01-26 ENCOUNTER — Encounter: Payer: Self-pay | Admitting: Gynecologic Oncology

## 2014-01-26 ENCOUNTER — Telehealth: Payer: Self-pay | Admitting: Gynecologic Oncology

## 2014-01-26 ENCOUNTER — Ambulatory Visit: Payer: Managed Care, Other (non HMO) | Attending: Gynecologic Oncology | Admitting: Gynecologic Oncology

## 2014-01-26 VITALS — BP 148/78 | HR 85 | Temp 98.3°F | Resp 16 | Ht 63.0 in | Wt 189.3 lb

## 2014-01-26 DIAGNOSIS — C541 Malignant neoplasm of endometrium: Secondary | ICD-10-CM

## 2014-01-26 NOTE — Telephone Encounter (Signed)
Spoke with patient about appointment with Dr. Hassell Done on May 7 at 9:20.  Advised to arrive 30 minutes early.  No concerns voiced.  Advised to call for any concerns.

## 2014-01-26 NOTE — Progress Notes (Signed)
Follow Up Note: Gyn-Onc  [name] [name] 58 y.o. female  CC:  Chief Complaint  Patient presents with  . 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  She underwent a TAH/BSO and pelvic lymphadenectomy on July 07, 2011 by Dr. Fermin Schwab. Pathology was favorable and she did not receive any adjuvant therapy.  Interval History:  She presents today for continued follow up.  She continues to exercise daily and states that she feels great.  She continues to make better choices with dietary selections.  She continues to smoke 0.5 packs of cigarettes daily and is not ready to quit smoking at this time but plans to in the future.  She is due for her mammogram in October.  Since her last visit, she reports the development of intermittent nausea and vomiting with a "bulge above the bellybutton."  She has noticed the area since recent weight loss and reports episodes of nausea with occasional vomiting once monthly for the past several months.  When the nausea/vomiting develops, she reports firmness at the site above the umbilicus that resolves after one to two days.  She has not been evaluated for this.  She denies lifting moderate amounts of weight due to a past rotator cuff injury during a fall.  Intermittent constipation which she takes daily magnesium for.  "I am not sure if it is a hernia or not, but can't I wait until it strangulates to have surgery."  Reports seeing Dr. Johnathan Hausen at [facility] in the past for gallbladder surgery.  She has had a colonoscopy and is not sure of the date but it has been greater than five years.  She continues reports [name] [name] as "kind of" her primary care provider.   Review of Systems  Constitutional: Feels well.  No fever, chills, early satiety, or unintentional weight loss or gain. Cardiovascular: No chest pain, shortness of breath, or edema.  Pulmonary:  No  cough or wheeze. Gastrointestinal: Nausea and minimal vomiting once monthly.  No diarrhea. No bright red blood per rectum or change in bowel movement.  Genitourinary: No frequency, urgency, or dysuria. No vaginal bleeding or discharge.  Musculoskeletal: No myalgia or joint pain. Neurologic: No weakness, numbness, or change in gait.  Psychology: No depression, anxiety, or insomnia.  Health Maintenance: Mammogram:  08/11/13 Pap Smear:  Last 07/2013 Colonoscopy:  Has had per patient  Current Meds:  Outpatient Encounter Prescriptions as of 01/26/2014  Medication Sig  . magnesium oxide (MAG-OX) 400 MG tablet Take 400 mg by mouth daily.  Cristino Martes Root 250 MG CAPS Take 4 capsules by mouth 2 (two) times daily.  . Ibuprofen (ADVIL) 200 MG CAPS Take by mouth as needed.  . [DISCONTINUED] Naproxen Sodium (ALEVE PO) Take 1 tablet by mouth as needed.    Allergy:  Allergies  Allergen Reactions  . Codeine     Social Hx:   History   Social History  . Marital Status: Divorced    Spouse Name: N/A    Number of Children: N/A  . Years of Education: N/A   Occupational History  . Not on file.   Social History Main Topics  . Smoking status: Current Every Day Smoker -- 0.50 packs/day    Types: Cigarettes  . Smokeless tobacco: Not on file     Comment: smoking cessation information given  . Alcohol Use: Yes  Comment: rarely  . Drug Use: No  . Sexual Activity: No   Other Topics Concern  . Not on file   Social History Narrative  . No narrative on file    Past Surgical Hx:  Past Surgical History  Procedure Laterality Date  . Cholecystectomy    . Cervical cone biopsy    . Abdominal hysterectomy      TAH, BSO, pelvic lymphadenectomy    Past Medical Hx:  Past Medical History  Diagnosis Date  . Cancer     endometrial ca    Family Hx: History reviewed. No pertinent family history.  Vitals:  Blood pressure 148/78, pulse 85, temperature 98.3 F (36.8 C), resp. rate 16, height  5\' 3"  (1.6 m), weight 189 lb 4.8 oz (85.866 kg).  Physical Exam:  General: Well developed, well nourished female in no acute distress. Alert and oriented x 3.  Head/Neck: Sclerae anicteric.  Oropharynx clear.  Supple without any enlargements.  Lymph node survey: No cervical, supraclavicular, or inguinal adenopathy.  Cardiovascular: Regular rate and rhythm. S1 and S2 normal.  Lungs: Clear to auscultation bilaterally. No wheezes/crackles/rhonchi noted.  Skin: No rashes or lesions present. Back: No CVA tenderness.  Abdomen: Abdomen soft, non-tender and obese. Active bowel sounds in all quadrants.  Mild firmness noted with coughing above the umbilicus measuring.  Potential ventral hernia palpated that is reducible above the umbilicus.        Genitourinary:    Vulva/vagina: Normal external female genitalia. No lesions.    Urethra: No lesions or masses.    Vagina: Atrophic without any lesions. No palpable masses. No vaginal bleeding or drainage noted. Rectal: Good tone, no masses, no cul de sac nodularity.  Extremities: No bilateral cyanosis, edema, or clubbing.   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  She underwent a TAH/BSO and pelvic lymphadenectomy on July 07, 2011 by Dr. Fermin Schwab.  She is clinically free of disease at this time.  She is encouraged to continue her fitness classes along with efforts to begin smoking cessation.  She is to follow up in six months at [facility] or sooner if needed.  We will make a referral for her to see Dr. Johnathan Hausen for evaluation of possible hernia per patient request.  She is advised to call for any questions or concerns.  The patient was reviewed with Dr. Fermin Schwab.    Dorothyann Gibbs, NP 01/26/2014, 11:00 AM

## 2014-01-26 NOTE — Patient Instructions (Signed)
We will contact Dr. Earlie Server office to arrange for an appointment.  His office number is [phone] for your records.  Plan to follow up in six months or sooner if needed and call for the development of vaginal bleeding or other issues.  We will perform a pap smear in six months.  Please call for any questions or concerns.    Hernia  A hernia occurs when an internal organ pushes out through a weak spot in the abdominal wall. Hernias most commonly occur in the groin and around the navel. Hernias often can be pushed back into place (reduced). Most hernias tend to get worse over time. Some abdominal hernias can get stuck in the opening (irreducible or incarcerated hernia) and cannot be reduced. An irreducible abdominal hernia which is tightly squeezed into the opening is at risk for impaired blood supply (strangulated hernia). A strangulated hernia is a medical emergency. Because of the risk for an irreducible or strangulated hernia, surgery may be recommended to repair a hernia. CAUSES   Heavy lifting.  Prolonged coughing.  Straining to have a bowel movement.  A cut (incision) made during an abdominal surgery. HOME CARE INSTRUCTIONS   Bed rest is not required. You may continue your normal activities.  Avoid lifting more than 10 pounds (4.5 kg) or straining.  Cough gently. If you are a smoker it is best to stop. Even the best hernia repair can break down with the continual strain of coughing. Even if you do not have your hernia repaired, a cough will continue to aggravate the problem.  Do not wear anything tight over your hernia. Do not try to keep it in with an outside bandage or truss. These can damage abdominal contents if they are trapped within the hernia sac.  Eat a normal diet.  Avoid constipation. Straining over long periods of time will increase hernia size and encourage breakdown of repairs. If you cannot do this with diet alone, stool softeners may be used. SEEK IMMEDIATE MEDICAL  CARE IF:   You have a fever.  You develop increasing abdominal pain.  You feel nauseous or vomit.  Your hernia is stuck outside the abdomen, looks discolored, feels hard, or is tender.  You have any changes in your bowel habits or in the hernia that are unusual for you.  You have increased pain or swelling around the hernia.  You cannot push the hernia back in place by applying gentle pressure while lying down. MAKE SURE YOU:   Understand these instructions.  Will watch your condition.  Will get help right away if you are not doing well or get worse. Document Released: 10/05/2005 Document Revised: 12/28/2011 Document Reviewed: 05/24/2008 [facility] Patient Information 2014 [name] [name].

## 2014-02-22 ENCOUNTER — Encounter (INDEPENDENT_AMBULATORY_CARE_PROVIDER_SITE_OTHER): Payer: Self-pay | Admitting: Surgery

## 2014-02-22 ENCOUNTER — Ambulatory Visit (INDEPENDENT_AMBULATORY_CARE_PROVIDER_SITE_OTHER): Payer: Managed Care, Other (non HMO) | Admitting: Surgery

## 2014-02-22 VITALS — BP 132/84 | HR 84 | Temp 98.5°F | Resp 18 | Ht 63.0 in | Wt 186.0 lb

## 2014-02-22 DIAGNOSIS — K439 Ventral hernia without obstruction or gangrene: Secondary | ICD-10-CM

## 2014-02-22 NOTE — Patient Instructions (Signed)
Thanks for your patience.  If you need further assistance after leaving the office, please call our office and speak with a [facility] nurse.  [phone].  If you want to leave a message for Dr. Dredyn Gubbels, please call his office phone at [phone]. 

## 2014-02-22 NOTE — Progress Notes (Signed)
Chief Complaint:  Ventral hernia  History of Present Illness:  [name] [name] is an 58 y.o. female who underwent pelvic surgery for favorable stage endometrial cancer in 2012.  She has begun working out and has developed a bulge in the upper abdomen.  She has not had any episodes of incarceration.  Since last summer she has been working with a Physiological scientist making great strides in her physical fitness. With this there is some weight loss as well as aerobic fitness. We discussed management of this ventral hernia. On examination this is about 2 fingerbreadths at the upper portion of her prior lower midline incision and is easily reducible. I examined her both in the standing and lying positions.  Past Medical History  Diagnosis Date  . Cancer     endometrial ca    Past Surgical History  Procedure Laterality Date  . Cholecystectomy    . Cervical cone biopsy    . Abdominal hysterectomy      TAH, BSO, pelvic lymphadenectomy    Current Outpatient Prescriptions  Medication Sig Dispense Refill  . Aspirin-Acetaminophen (GOODY BODY PAIN) 500-325 MG PACK Take by mouth.      Marland Kitchen BIOTIN PO Take by mouth.      . Ibuprofen (ADVIL) 200 MG CAPS Take by mouth as needed.      . magnesium oxide (MAG-OX) 400 MG tablet Take 400 mg by mouth daily.      . Probiotic Product (PROBIOTIC DAILY PO) Take by mouth.      . ranitidine (ZANTAC) 150 MG capsule Take 150 mg by mouth 2 (two) times daily.      Cristino Martes Root 250 MG CAPS Take 4 capsules by mouth 2 (two) times daily.       No current facility-administered medications for this visit.   Codeine No family history on file. Social History:   reports that she has been smoking Cigarettes.  She has been smoking about 0.50 packs per day. She does not have any smokeless tobacco history on file. She reports that she drinks alcohol. She reports that she does not use illicit drugs.   REVIEW OF SYSTEMS - PERTINENT POSITIVES ONLY: Prior lap chole by me in the  1990s-old chart not available  Physical Exam:   Blood pressure 132/84, pulse 84, temperature 98.5 F (36.9 C), resp. rate 18, height 5\' 3"  (1.6 m), weight 186 lb (84.369 kg). Body mass index is 32.96 kg/(m^2).  Gen:  WDWN [place] female NAD  Neurological: Alert and oriented to person, place, and time. Motor and sensory function is grossly intact  Head: Normocephalic and atraumatic.  Eyes: Conjunctivae are normal. Pupils are equal, round, and reactive to light. No scleral icterus.  Abdomen:  2 fingerbreadth fascial defect at the upper portion of midline incision. This defect is slightly to the left of the umbilicus at the level of the umbilicus.  Musculoskeletal: Normal range of motion. Extremities are nontender. No cyanosis, edema or clubbing noted Lymphadenopathy: No cervical, preauricular, postauricular or axillary adenopathy is present Skin: Skin is warm and dry. No rash noted. No diaphoresis. No erythema. No pallor. Pscyh: Normal mood and affect. Behavior is normal. Judgment and thought content normal.   LABORATORY RESULTS: No results found for this or any previous visit (from the past 48 hour(s)).  RADIOLOGY RESULTS: No results found.  Problem List: Patient Active Problem List   Diagnosis Date Noted  . Ventral hernia 02/22/2014  . Endometrial cancer 08/03/2012  . SINUSITIS, CHRONIC NOS 08/12/2007  .  SYMPTOM, MALAISE AND FATIGUE NEC 08/12/2007  . DISORDERS, [place] D/T MENTAL DISORDER 05/30/2007  . Unspecified Myalgia and Myositis 05/30/2007  . DEPRESSION 04/14/2007    Assessment & Plan: I think it would be reasonable for her to continue her exercise routine including abdominal exercises in the near future. I think that she is making a lot of progress and that we need to get her to a plateau of the fitness and weight loss before performing her surgery. Weight loss would certainly make this more successful and we would not risk setting her back from her goal of fitness. I  will see her again in 4 months in routine follow up.    Matt B. Hassell Done, MD, [facility] [phone] beeper [phone]  02/22/2014 9:47 AM

## 2014-07-27 ENCOUNTER — Encounter: Payer: Self-pay | Admitting: Gynecologic Oncology

## 2014-07-27 ENCOUNTER — Other Ambulatory Visit (HOSPITAL_COMMUNITY)
Admission: RE | Admit: 2014-07-27 | Discharge: 2014-07-27 | Disposition: A | Discharge status: Home / Self Care | Payer: Managed Care, Other (non HMO) | Source: Ambulatory Visit | Attending: Gynecologic Oncology | Admitting: Gynecologic Oncology

## 2014-07-27 ENCOUNTER — Ambulatory Visit: Payer: Managed Care, Other (non HMO) | Attending: Gynecologic Oncology | Admitting: Gynecologic Oncology

## 2014-07-27 VITALS — BP 123/70 | HR 85 | Temp 97.8°F | Resp 20 | Ht 63.0 in | Wt 180.0 lb

## 2014-07-27 DIAGNOSIS — Z08 Encounter for follow-up examination after completed treatment for malignant neoplasm: Secondary | ICD-10-CM | POA: Diagnosis not present

## 2014-07-27 DIAGNOSIS — Z8542 Personal history of malignant neoplasm of other parts of uterus: Secondary | ICD-10-CM | POA: Diagnosis not present

## 2014-07-27 DIAGNOSIS — Z01411 Encounter for gynecological examination (general) (routine) with abnormal findings: Secondary | ICD-10-CM | POA: Diagnosis present

## 2014-07-27 DIAGNOSIS — C541 Malignant neoplasm of endometrium: Secondary | ICD-10-CM | POA: Diagnosis not present

## 2014-07-27 DIAGNOSIS — Z90722 Acquired absence of ovaries, bilateral: Secondary | ICD-10-CM | POA: Diagnosis not present

## 2014-07-27 DIAGNOSIS — Z90710 Acquired absence of both cervix and uterus: Secondary | ICD-10-CM | POA: Insufficient documentation

## 2014-07-27 NOTE — Progress Notes (Signed)
Follow Up Note: Gyn-Onc  Murray Hodgkins Vanleer 58 y.o. female  CC:  Chief Complaint  Patient presents with  . 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  She underwent a TAH/BSO and pelvic lymphadenectomy on July 07, 2011 by Dr. Fermin Schwab. Pathology was favorable and she did not receive any adjuvant therapy.  Interval History:  She presents today for continued follow up.  She continues to exercise and has added another day to her workout schedule since she has hit a plateau around 180 lbs.  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  She has been receiving physical therapy for this and sees "a little difference."  She continues to make better choices with dietary selections.  She continues to smoke 0.5 packs of cigarettes daily and is not ready to quit smoking at this time but plans to in the future.  She states she has started thinking about smoking cessation and family/friends encouraged her to quit for her 60 th birthday but she was not ready.  She can tell a difference in her exercise endurance with smoking and will call the office when she is ready to quit.  She is due for her mammogram in October.  She has seen Dr. Hassell Done with [facility] about her hernia and states she is to see him next week for follow up.  The hernia causes her no problems unless she is performing strenuous core work.  She denies lifting moderate amounts of weight due to a past rotator cuff injury during a fall.  Intermittent constipation which she takes daily magnesium for.  She has not seen Dr. Arnoldo Morale for an annual physical and states she received a letter that he is no longer seeing patients in the office for primary care.  She has had a colonoscopy and is not sure of the date but it has been greater than five years.  She has not had any lab work checked including cholesterol, etc  and states she "would not take medicine for it anyways."  She denies ever having a bone density test and would like to hold off on lab work, etc.    Review of Systems  Constitutional: Feels well.  No fever, chills, early satiety, or unintentional weight loss or gain. Cardiovascular: No chest pain, shortness of breath, or edema.  Pulmonary:  No cough or wheeze. Gastrointestinal: No nausea, vomiting, diarrhea. No bright red blood per rectum or change in bowel movement.  Genitourinary: No frequency, urgency, or dysuria. No vaginal bleeding or discharge.  Musculoskeletal: Intermittent IT band irritation with the left leg.  No joint pain. Neurologic: No weakness, numbness, or change in gait.  Psychology: No depression, anxiety, or insomnia.  Health Maintenance: Mammogram:  08/11/13.  Due 07/2014 Pap Smear:  Last 07/2013 Flu Shot: Oct 2015 Colonoscopy:  Has had per patient over 5 years ago and does not want to schedule another at this time  Current Meds:  Outpatient Encounter Prescriptions as of 07/27/2014  Medication Sig  . Aspirin-Acetaminophen (GOODY BODY PAIN) 500-325 MG PACK Take by mouth.  Marland Kitchen BIOTIN PO Take by mouth.  . Calcium Carbonate-Vitamin D (CALCIUM + D PO) Take 2 tablets by mouth daily.  . Ibuprofen (ADVIL) 200 MG CAPS Take by mouth as needed.  . magnesium oxide (MAG-OX) 400 MG tablet Take 400 mg by mouth  daily.  . Probiotic Product (PROBIOTIC DAILY PO) Take by mouth.  . ranitidine (ZANTAC) 150 MG capsule Take 150 mg by mouth as needed.   Cristino Martes Root 250 MG CAPS Take 4 capsules by mouth 2 (two) times daily.    Allergy:  Allergies  Allergen Reactions  . Codeine     Social Hx:   History   Social History  . Marital Status: Divorced    Spouse Name: N/A    Number of Children: N/A  . Years of Education: N/A   Occupational History  . Not on file.   Social History Main Topics  . Smoking status: Current Every Day Smoker -- 0.50 packs/day    Types: Cigarettes  .  Smokeless tobacco: Not on file     Comment: smoking cessation information given  . Alcohol Use: Yes     Comment: rarely  . Drug Use: No  . Sexual Activity: No   Other Topics Concern  . Not on file   Social History Narrative  . No narrative on file    Past Surgical Hx:  Past Surgical History  Procedure Laterality Date  . Cholecystectomy    . Cervical cone biopsy    . Abdominal hysterectomy      TAH, BSO, pelvic lymphadenectomy    Past Medical Hx:  Past Medical History  Diagnosis Date  . Cancer     endometrial ca    Family Hx: History reviewed. No pertinent family history.  Vitals:  Blood pressure 123/70, pulse 85, temperature 97.8 F (36.6 C), temperature source Oral, resp. rate 20, height 5\' 3"  (1.6 m), weight 180 lb (81.647 kg).  Physical Exam:  General: Well developed, well nourished female in no acute distress. Alert and oriented x 3.  Head/Neck: Sclerae anicteric.  Oropharynx clear.  Supple without any enlargements.  Lymph node survey: No cervical, supraclavicular, or inguinal adenopathy.  Cardiovascular: Regular rate and rhythm. S1 and S2 normal.  Lungs: Clear to auscultation bilaterally. No wheezes/crackles/rhonchi noted.  Skin: No rashes or lesions present. Back: No CVA tenderness.  Abdomen: Abdomen soft, non-tender and slightly obese. Active bowel sounds in all quadrants.  Ventral hernia at the level of the umbilicus reducible.  Genitourinary:    Vulva/vagina: Normal external female genitalia. No lesions.    Urethra: No lesions or masses.    Vagina: Atrophic without any lesions. No palpable masses. No vaginal bleeding or drainage noted.  Pap                smear obtained.  Rectal: Good tone, no masses, no cul de sac nodularity.  Extremities: No bilateral cyanosis, edema, or clubbing.   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  She underwent a TAH/BSO and pelvic lymphadenectomy on July 07, 2011 by Dr.  Fermin Schwab.  She is clinically free of disease at this time.  We will contact her with the results of her pap smear from today.  She is encouraged to continue her fitness classes and weight loss efforts along with taking steps to begin smoking cessation.  The importance of having a PCP stressed but patient wanting to wait on referral at this time.  She is to follow up in one year at [facility] or sooner if needed.  She is advised to call for any questions or concerns.  The patient was reviewed with Dr. Fermin Schwab.    CROSS, MELISSA DEAL, NP 07/27/2014, 11:23 AM

## 2014-07-27 NOTE — Patient Instructions (Signed)
We will contact you with the results of your pap smear from today.  Plan to follow up in one year.  Please call for any questions or concerns.

## 2014-07-31 LAB — CYTOLOGY - PAP

## 2014-08-01 ENCOUNTER — Encounter (INDEPENDENT_AMBULATORY_CARE_PROVIDER_SITE_OTHER): Payer: Managed Care, Other (non HMO) | Admitting: Surgery

## 2014-08-02 ENCOUNTER — Telehealth: Payer: Self-pay | Admitting: *Deleted

## 2014-08-02 NOTE — Telephone Encounter (Signed)
Message copied by Christa See on Thu Aug 02, 2014 10:19 AM ------      Message from: CROSS, MELISSA D      Created: Thu Aug 02, 2014  9:58 AM       Please let her know that her pap smear was normal.  Thank you            Melissa       ----- Message -----         From: [facility] Interface         Sent: 08/01/2014   1:10 PM           To: Dorothyann Gibbs, NP                   ------

## 2014-08-02 NOTE — Telephone Encounter (Signed)
Called and spoke with patient and let her know pap smear was normal. Pt appreciative of call.

## 2015-08-09 ENCOUNTER — Ambulatory Visit: Payer: Managed Care, Other (non HMO) | Attending: Gynecologic Oncology | Admitting: Gynecologic Oncology

## 2015-08-09 ENCOUNTER — Other Ambulatory Visit (HOSPITAL_BASED_OUTPATIENT_CLINIC_OR_DEPARTMENT_OTHER): Payer: Managed Care, Other (non HMO)

## 2015-08-09 ENCOUNTER — Other Ambulatory Visit (HOSPITAL_COMMUNITY)
Admission: RE | Admit: 2015-08-09 | Discharge: 2015-08-09 | Disposition: A | Discharge status: Home / Self Care | Payer: Managed Care, Other (non HMO) | Source: Ambulatory Visit | Attending: Radiation Oncology | Admitting: Radiation Oncology

## 2015-08-09 VITALS — BP 142/83 | HR 79 | Temp 98.7°F | Resp 20 | Ht 63.0 in | Wt 173.0 lb

## 2015-08-09 DIAGNOSIS — Z01411 Encounter for gynecological examination (general) (routine) with abnormal findings: Secondary | ICD-10-CM | POA: Diagnosis present

## 2015-08-09 DIAGNOSIS — C541 Malignant neoplasm of endometrium: Secondary | ICD-10-CM

## 2015-08-09 LAB — CBC WITH DIFFERENTIAL/PLATELET
BASO%: 0.9 % (ref 0.0–2.0)
Basophils Absolute: 0.1 10*3/uL (ref 0.0–0.1)
EOS ABS: 0.3 10*3/uL (ref 0.0–0.5)
EOS%: 4 % (ref 0.0–7.0)
HEMATOCRIT: 48.2 % — AB (ref 34.8–46.6)
HGB: 16.1 g/dL — ABNORMAL HIGH (ref 11.6–15.9)
LYMPH#: 2 10*3/uL (ref 0.9–3.3)
LYMPH%: 28 % (ref 14.0–49.7)
MCH: 31.5 pg (ref 25.1–34.0)
MCHC: 33.4 g/dL (ref 31.5–36.0)
MCV: 94.4 fL (ref 79.5–101.0)
MONO#: 0.6 10*3/uL (ref 0.1–0.9)
MONO%: 8.3 % (ref 0.0–14.0)
NEUT%: 58.8 % (ref 38.4–76.8)
NEUTROS ABS: 4.3 10*3/uL (ref 1.5–6.5)
PLATELETS: 179 10*3/uL (ref 145–400)
RBC: 5.11 10*6/uL (ref 3.70–5.45)
RDW: 13.6 % (ref 11.2–14.5)
WBC: 7.3 10*3/uL (ref 3.9–10.3)

## 2015-08-09 LAB — LIPID PANEL
Cholesterol: 230 mg/dL — ABNORMAL HIGH (ref 125–200)
HDL: 55 mg/dL (ref >=46)
LDL CALC: 156 mg/dL — AB (ref <130)
TRIGLYCERIDES: 94 mg/dL (ref <150)
Total CHOL/HDL Ratio: 4.2 Ratio (ref <=5.0)
VLDL: 19 mg/dL (ref <30)

## 2015-08-09 LAB — COMPREHENSIVE METABOLIC PANEL (CC13)
ALBUMIN: 3.7 g/dL (ref 3.5–5.0)
ALK PHOS: 80 U/L (ref 40–150)
ALT: 15 U/L (ref 0–55)
ANION GAP: 9 meq/L (ref 3–11)
AST: 17 U/L (ref 5–34)
BUN: 14 mg/dL (ref 7.0–26.0)
CALCIUM: 9.5 mg/dL (ref 8.4–10.4)
CO2: 23 mEq/L (ref 22–29)
CREATININE: 0.8 mg/dL (ref 0.6–1.1)
Chloride: 110 mEq/L — ABNORMAL HIGH (ref 98–109)
EGFR: 77 mL/min/{1.73_m2} — AB (ref >90)
Glucose: 91 mg/dl (ref 70–140)
Potassium: 4.4 mEq/L (ref 3.5–5.1)
Sodium: 142 mEq/L (ref 136–145)
TOTAL PROTEIN: 7.2 g/dL (ref 6.4–8.3)

## 2015-08-09 NOTE — Progress Notes (Signed)
Follow Up Note: Gyn-Onc  [name] [name] 59 y.o. female  CC:  Chief Complaint  Patient presents with  . 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  She underwent a TAH/BSO and pelvic lymphadenectomy on July 07, 2011 by Dr. Fermin Schwab. Pathology was favorable and she did not receive any adjuvant therapy.  Interval History:  She presents today for continued follow up.  She continues to exercise every day and has a different routine each day.  She continues to make better choices with dietary selections but states she cheats sometimes.  She continues to smoke 0.5 packs of cigarettes daily and is not ready to quit smoking at this time.  She is due for her mammogram in October and states she will contact [facility] to arrange it.  Denies any changes in her breasts.  She has had no issues with her hernia and states she is supposed to follow up with Dr. Hassell Done on a PRN basis.  She states she is not up to date with her colonoscopy and states it will have to be arranged next year.  Agreeable to taking home a hemmocult card kit.  Tolerating diet with no nausea or emesis but states she has started to discover certain foods such as garlic and onion that upset her stomach.  She does not have a PCP and she goes to [facility] for issues that arise such as spraining her finger when exercising.  Agreeable to having labs checked today along with a lipid panel since she has not eaten this am.  See below for a detailed review of systems.  No other concerns voiced.        Review of Systems  Constitutional: Feels well.  No fever, chills, early satiety, or unintentional weight loss or gain. Cardiovascular: No chest pain, shortness of breath, or edema.  Pulmonary:  No cough or wheeze. Gastrointestinal: No nausea, vomiting, diarrhea. No bright red blood per rectum or change in bowel movement.   Genitourinary: No frequency, urgency, or dysuria. No vaginal bleeding or discharge.  Musculoskeletal: No joint or muscular pain, difficulty ambulating, numbness tingling in the feet. Neurologic: No weakness, numbness, or change in gait.  Psychology: No depression, anxiety, or insomnia.  Health Maintenance: Mammogram:  08/11/13.  Due 07/2014 Pap Smear:  Last 07/2014 Flu Shot: Oct 2016 Colonoscopy:  Has had per patient over 5 years ago and does not want to schedule another at this time  Current Meds:  Outpatient Encounter Prescriptions as of 08/09/2015  Medication Sig  . Aspirin-Acetaminophen (GOODY BODY PAIN) 500-325 MG PACK Take by mouth.  Marland Kitchen BIOTIN PO Take by mouth.  . Calcium Carbonate-Vitamin D (CALCIUM + D PO) Take 2 tablets by mouth daily.  . Ibuprofen (ADVIL) 200 MG CAPS Take by mouth as needed.  . magnesium oxide (MAG-OX) 400 MG tablet Take 400 mg by mouth daily.  . ranitidine (ZANTAC) 150 MG capsule Take 150 mg by mouth as needed.   Cristino Martes Root 250 MG CAPS Take 4 capsules by mouth 2 (two) times daily.  . [DISCONTINUED] Probiotic Product (PROBIOTIC DAILY PO) Take by mouth.   No facility-administered encounter medications on file as of 08/09/2015.    Allergy:  Allergies  Allergen Reactions  . Codeine     Social Hx:   Social History   Social History  . Marital Status: Divorced  Spouse Name: N/A  . Number of Children: N/A  . Years of Education: N/A   Occupational History  . Not on file.   Social History Main Topics  . Smoking status: Current Every Day Smoker -- 0.50 packs/day    Types: Cigarettes  . Smokeless tobacco: Not on file     Comment: smoking cessation information given  . Alcohol Use: Yes     Comment: rarely  . Drug Use: No  . Sexual Activity: No   Other Topics Concern  . Not on file   Social History Narrative  . No narrative on file    Past Surgical Hx:  Past Surgical History  Procedure Laterality Date  . Cholecystectomy    .  Cervical cone biopsy    . Abdominal hysterectomy      TAH, BSO, pelvic lymphadenectomy    Past Medical Hx:  Past Medical History  Diagnosis Date  . Cancer     endometrial ca    Family Hx: No family history on file.  Vitals:  Blood pressure 142/83, pulse 79, temperature 98.7 F (37.1 C), temperature source Oral, resp. rate 20, height 5' 3"  (1.6 m), weight 173 lb (78.472 kg).  Physical Exam:  General: Well developed, well nourished female in no acute distress. Alert and oriented x 3.  Head/Neck: Sclerae anicteric.  Oropharynx clear.  Supple without any enlargements.  Lymph node survey: No cervical, supraclavicular, or inguinal adenopathy.  Cardiovascular: Regular rate and rhythm. S1 and S2 normal.  Lungs: Clear to auscultation bilaterally. No wheezes/crackles/rhonchi noted.  Breasts: Inspection negative.  No nodularity, masses, erythema, or discharge noted bilaterally. Skin: No rashes or lesions present. Back: No CVA tenderness.  Abdomen: Abdomen soft, non-tender and slightly obese. Active bowel sounds in all quadrants.  Ventral hernia at the level of the umbilicus reducible.  Genitourinary:    Vulva/vagina: Normal external female genitalia. No lesions.    Urethra: No lesions or masses.    Vagina: Atrophic without any lesions. No palpable masses. No vaginal bleeding or drainage noted.  ThinPrep Pap smear obtained.  Rectal: Good tone, no masses, no cul de sac nodularity.  Extremities: No bilateral cyanosis, edema, or clubbing.   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  She underwent a TAH/BSO and pelvic lymphadenectomy on July 07, 2011 by Dr. Fermin Schwab.  She is clinically free of disease at this time.  We will contact her with the results of her pap smear and lab work from today.  She is encouraged to continue her fitness classes and weight loss efforts along with taking steps to begin smoking cessation.  The importance of  having a PCP stressed but patient wanting to continue to wait on referral at this time.  She is to follow up in one year at [place] or sooner if needed.  She is advised to call for any questions or concerns.  The patient was reviewed with Dr. Fermin Schwab.    Kharisma Glasner DEAL, NP 08/09/2015, 3:47 PM

## 2015-08-09 NOTE — Patient Instructions (Signed)
Doing great!  Keep up the exercise!  We will call you with the results of your pap smear and lab work from today.  Please plan to follow up in one year or sooner if needed.  Please let us know if we need to set you up for your mammogram.  Please call for any questions or concerns.  Menopause is a normal process in which your reproductive ability comes to an end. This process happens gradually over a span of months to years, usually between the ages of 63 and 10. Menopause is complete when you have missed 12 consecutive menstrual periods. It is important to talk with your health care provider about some of the most common conditions that affect postmenopausal women, such as heart disease, cancer, and bone loss (osteoporosis). Adopting a healthy lifestyle and getting preventive care can help to promote your health and wellness. Those actions can also lower your chances of developing some of these common conditions.   WHAT SHOULD I KNOW ABOUT HEART DISEASE AND STROKE? Heart disease, heart attack, and stroke become more likely as you age. This may be due, in part, to the hormonal changes that your body experiences during menopause. These can affect how your body processes dietary fats, triglycerides, and cholesterol. Heart attack and stroke are both medical emergencies. There are many things that you can do to help prevent heart disease and stroke:  Have your blood pressure checked at least every 1-2 years. High blood pressure causes heart disease and increases the risk of stroke.  If you are 46-56 years old, ask your health care provider if you should take aspirin to prevent a heart attack or a stroke.  Do not use any tobacco products, including cigarettes, chewing tobacco, or electronic cigarettes. If you need help quitting, ask your health care provider.  It is important to eat a healthy diet and maintain a healthy weight.  Be sure to include plenty of vegetables, fruits, low-fat dairy products, and  lean protein.  Avoid eating foods that are high in solid fats, added sugars, or salt (sodium).  Get regular exercise. This is one of the most important things that you can do for your health.  Try to exercise for at least 150 minutes each week. The type of exercise that you do should increase your heart rate and make you sweat. This is known as moderate-intensity exercise.  Try to do strengthening exercises at least twice each week. Do these in addition to the moderate-intensity exercise.  Know your numbers.Ask your health care provider to check your cholesterol and your blood glucose. Continue to have your blood tested as directed by your health care provider. WHAT SHOULD I KNOW ABOUT CANCER SCREENING? There are several types of cancer. Take the following steps to reduce your risk and to catch any cancer development as early as possible. Breast Cancer  Practice breast self-awareness.  This means understanding how your breasts normally appear and feel.  It also means doing regular breast self-exams. Let your health care provider know about any changes, no matter how small.  If you are 49 or older, have a clinician do a breast exam (clinical breast exam or CBE) every year. Depending on your age, family history, and medical history, it may be recommended that you also have a yearly breast X-ray (mammogram).  If you have a family history of breast cancer, talk with your health care provider about genetic screening.  If you are at high risk for breast cancer, talk with your  health care provider about having an MRI and a mammogram every year.  Breast cancer (BRCA) gene test is recommended for women who have family members with BRCA-related cancers. Results of the assessment will determine the need for genetic counseling and BRCA1 and for BRCA2 testing. BRCA-related cancers include these types:  Breast. This occurs in males or females.  Ovarian.  Tubal. This may also be called fallopian  tube cancer.  Cancer of the abdominal or pelvic lining (peritoneal cancer).  Prostate.  Pancreatic. Cervical, Uterine, and Ovarian Cancer Your health care provider may recommend that you be screened regularly for cancer of the pelvic organs. These include your ovaries, uterus, and vagina. This screening involves a pelvic exam, which includes checking for microscopic changes to the surface of your cervix (Pap test).  For women ages 21-65, health care providers may recommend a pelvic exam and a Pap test every three years. For women ages 1-65, they may recommend the Pap test and pelvic exam, combined with testing for human papilloma virus (HPV), every five years. Some types of HPV increase your risk of cervical cancer. Testing for HPV may also be done on women of any age who have unclear Pap test results.  Other health care providers may not recommend any screening for nonpregnant women who are considered low risk for pelvic cancer and have no symptoms. Ask your health care provider if a screening pelvic exam is right for you.  If you have had past treatment for cervical cancer or a condition that could lead to cancer, you need Pap tests and screening for cancer for at least 20 years after your treatment. If Pap tests have been discontinued for you, your risk factors (such as having a new sexual partner) need to be reassessed to determine if you should start having screenings again. Some women have medical problems that increase the chance of getting cervical cancer. In these cases, your health care provider may recommend that you have screening and Pap tests more often.  If you have a family history of uterine cancer or ovarian cancer, talk with your health care provider about genetic screening.  If you have vaginal bleeding after reaching menopause, tell your health care provider.  There are currently no reliable tests available to screen for ovarian cancer. Lung Cancer Lung cancer screening is  recommended for adults 57-54 years old who are at high risk for lung cancer because of a history of smoking. A yearly low-dose CT scan of the lungs is recommended if you:  Currently smoke.  Have a history of at least 30 pack-years of smoking and you currently smoke or have quit within the past 15 years. A pack-year is smoking an average of one pack of cigarettes per day for one year. Yearly screening should:  Continue until it has been 15 years since you quit.  Stop if you develop a health problem that would prevent you from having lung cancer treatment. Colorectal Cancer  This type of cancer can be detected and can often be prevented.  Routine colorectal cancer screening usually begins at age 54 and continues through age 81.  If you have risk factors for colon cancer, your health care provider may recommend that you be screened at an earlier age.  If you have a family history of colorectal cancer, talk with your health care provider about genetic screening.  Your health care provider may also recommend using home test kits to check for hidden blood in your stool.  A small camera at  the end of a tube can be used to examine your colon directly (sigmoidoscopy or colonoscopy). This is done to check for the earliest forms of colorectal cancer.  Direct examination of the colon should be repeated every 5-10 years until age 52. However, if early forms of precancerous polyps or small growths are found or if you have a family history or genetic risk for colorectal cancer, you may need to be screened more often. Skin Cancer  Check your skin from head to toe regularly.  Monitor any moles. Be sure to tell your health care provider:  About any new moles or changes in moles, especially if there is a change in a mole's shape or color.  If you have a mole that is larger than the size of a pencil eraser.  If any of your family members has a history of skin cancer, especially at a young age, talk  with your health care provider about genetic screening.  Always use sunscreen. Apply sunscreen liberally and repeatedly throughout the day.  Whenever you are outside, protect yourself by wearing long sleeves, pants, a wide-brimmed hat, and sunglasses. WHAT SHOULD I KNOW ABOUT OSTEOPOROSIS? Osteoporosis is a condition in which bone destruction happens more quickly than new bone creation. After menopause, you may be at an increased risk for osteoporosis. To help prevent osteoporosis or the bone fractures that can happen because of osteoporosis, the following is recommended:  If you are 61-23 years old, get at least 1,000 mg of calcium and at least 600 mg of vitamin D per day.  If you are older than age 3 but younger than age 84, get at least 1,200 mg of calcium and at least 600 mg of vitamin D per day.  If you are older than age 35, get at least 1,200 mg of calcium and at least 800 mg of vitamin D per day. Smoking and excessive alcohol intake increase the risk of osteoporosis. Eat foods that are rich in calcium and vitamin D, and do weight-bearing exercises several times each week as directed by your health care provider. WHAT SHOULD I KNOW ABOUT HOW MENOPAUSE AFFECTS [place]? Depression may occur at any age, but it is more common as you become older. Common symptoms of depression include:  Low or sad mood.  Changes in sleep patterns.  Changes in appetite or eating patterns.  Feeling an overall lack of motivation or enjoyment of activities that you previously enjoyed.  Frequent crying spells. Talk with your health care provider if you think that you are experiencing depression. WHAT SHOULD I KNOW ABOUT IMMUNIZATIONS? It is important that you get and maintain your immunizations. These include:  Tetanus, diphtheria, and pertussis (Tdap) booster vaccine.  Influenza every year before the flu season begins.  Pneumonia vaccine.  Shingles vaccine. Your health care provider may  also recommend other immunizations.   This information is not intended to replace advice given to you by your health care provider. Make sure you discuss any questions you have with your health care provider.   Document Released: 11/27/2005 Document Revised: 10/26/2014 Document Reviewed: 06/07/2014 Elsevier Interactive Patient Education [employer].

## 2015-08-12 ENCOUNTER — Telehealth: Payer: Self-pay | Admitting: Gynecologic Oncology

## 2015-08-12 NOTE — Telephone Encounter (Signed)
Patient returned call to the office.  Informed of lab work results.  Advised of lipid panel results as well.  Patient stating she will work on increasing her HDL and lowering her LDL levels without medication.  Lab work mailed to the patient along with up to date patient handouts on cholesterol and methods to lower cholesterol.  Patient will be contacted once her pap smear result is available.  Advised to call for any questions or concerns.

## 2015-08-12 NOTE — Telephone Encounter (Signed)
Left message for patient asking her to call the office to discuss the lab work from her visit on Friday.

## 2015-08-13 LAB — CYTOLOGY - PAP

## 2015-08-14 ENCOUNTER — Telehealth: Payer: Self-pay

## 2015-08-14 NOTE — Telephone Encounter (Signed)
Orders received from [facility] to contact the patient to update with PAP results from 08/09/15 was "NEG" . Patient contacted , no answer , left a detailed message with call back information if additional questions arise.

## 2018-01-27 ENCOUNTER — Emergency Department (HOSPITAL_COMMUNITY): Payer: 59

## 2018-01-27 ENCOUNTER — Encounter (HOSPITAL_COMMUNITY): Payer: Self-pay | Admitting: Emergency Medicine

## 2018-01-27 ENCOUNTER — Inpatient Hospital Stay (HOSPITAL_COMMUNITY)
Admission: EM | Admit: 2018-01-27 | Discharge: 2018-01-30 | DRG: 092 | Disposition: A | Discharge status: Home / Self Care | Payer: 59 | Attending: Internal Medicine | Admitting: Internal Medicine

## 2018-01-27 DIAGNOSIS — G9341 Metabolic encephalopathy: Secondary | ICD-10-CM

## 2018-01-27 DIAGNOSIS — T39094A Poisoning by salicylates, undetermined, initial encounter: Secondary | ICD-10-CM | POA: Diagnosis not present

## 2018-01-27 DIAGNOSIS — Z23 Encounter for immunization: Secondary | ICD-10-CM

## 2018-01-27 DIAGNOSIS — E876 Hypokalemia: Secondary | ICD-10-CM | POA: Diagnosis not present

## 2018-01-27 DIAGNOSIS — R4182 Altered mental status, unspecified: Secondary | ICD-10-CM

## 2018-01-27 DIAGNOSIS — E860 Dehydration: Secondary | ICD-10-CM | POA: Diagnosis present

## 2018-01-27 DIAGNOSIS — T39095A Adverse effect of salicylates, initial encounter: Secondary | ICD-10-CM | POA: Diagnosis present

## 2018-01-27 DIAGNOSIS — G43909 Migraine, unspecified, not intractable, without status migrainosus: Secondary | ICD-10-CM | POA: Diagnosis present

## 2018-01-27 DIAGNOSIS — Z888 Allergy status to other drugs, medicaments and biological substances status: Secondary | ICD-10-CM

## 2018-01-27 DIAGNOSIS — Z716 Tobacco abuse counseling: Secondary | ICD-10-CM

## 2018-01-27 DIAGNOSIS — Z79899 Other long term (current) drug therapy: Secondary | ICD-10-CM | POA: Diagnosis not present

## 2018-01-27 DIAGNOSIS — G92 Toxic encephalopathy: Secondary | ICD-10-CM | POA: Diagnosis not present

## 2018-01-27 DIAGNOSIS — Z720 Tobacco use: Secondary | ICD-10-CM | POA: Diagnosis present

## 2018-01-27 DIAGNOSIS — F17210 Nicotine dependence, cigarettes, uncomplicated: Secondary | ICD-10-CM | POA: Diagnosis present

## 2018-01-27 DIAGNOSIS — T39091A Poisoning by salicylates, accidental (unintentional), initial encounter: Secondary | ICD-10-CM | POA: Diagnosis present

## 2018-01-27 DIAGNOSIS — E872 Acidosis, unspecified: Secondary | ICD-10-CM | POA: Diagnosis present

## 2018-01-27 DIAGNOSIS — Z90710 Acquired absence of both cervix and uterus: Secondary | ICD-10-CM | POA: Diagnosis not present

## 2018-01-27 DIAGNOSIS — Z90722 Acquired absence of ovaries, bilateral: Secondary | ICD-10-CM

## 2018-01-27 DIAGNOSIS — Z9049 Acquired absence of other specified parts of digestive tract: Secondary | ICD-10-CM

## 2018-01-27 DIAGNOSIS — N179 Acute kidney failure, unspecified: Secondary | ICD-10-CM | POA: Diagnosis present

## 2018-01-27 DIAGNOSIS — Z8542 Personal history of malignant neoplasm of other parts of uterus: Secondary | ICD-10-CM | POA: Diagnosis not present

## 2018-01-27 DIAGNOSIS — Z885 Allergy status to narcotic agent status: Secondary | ICD-10-CM | POA: Diagnosis not present

## 2018-01-27 LAB — COMPREHENSIVE METABOLIC PANEL
ALBUMIN: 3.8 g/dL (ref 3.5–5.0)
ALK PHOS: 54 U/L (ref 38–126)
ALT: 11 U/L — ABNORMAL LOW (ref 14–54)
AST: 29 U/L (ref 15–41)
Anion gap: 16 — ABNORMAL HIGH (ref 5–15)
BILIRUBIN TOTAL: 0.8 mg/dL (ref 0.3–1.2)
BUN: 17 mg/dL (ref 6–20)
CO2: 15 mmol/L — ABNORMAL LOW (ref 22–32)
Calcium: 8.4 mg/dL — ABNORMAL LOW (ref 8.9–10.3)
Chloride: 108 mmol/L (ref 101–111)
Creatinine, Ser: 1.21 mg/dL — ABNORMAL HIGH (ref 0.44–1.00)
GFR calc Af Amer: 54 mL/min — ABNORMAL LOW (ref >60)
GFR calc non Af Amer: 47 mL/min — ABNORMAL LOW (ref >60)
GLUCOSE: 101 mg/dL — AB (ref 65–99)
POTASSIUM: 4.6 mmol/L (ref 3.5–5.1)
Sodium: 139 mmol/L (ref 135–145)
TOTAL PROTEIN: 7 g/dL (ref 6.5–8.1)

## 2018-01-27 LAB — URINALYSIS, ROUTINE W REFLEX MICROSCOPIC
Bilirubin Urine: NEGATIVE
Glucose, UA: NEGATIVE mg/dL
Ketones, ur: 20 mg/dL — AB
Leukocytes, UA: NEGATIVE
Nitrite: NEGATIVE
Protein, ur: NEGATIVE mg/dL
SPECIFIC GRAVITY, URINE: 1.013 (ref 1.005–1.030)
SQUAMOUS EPITHELIAL / LPF: NONE SEEN
pH: 5 (ref 5.0–8.0)

## 2018-01-27 LAB — CBC WITH DIFFERENTIAL/PLATELET
BASOS ABS: 0 10*3/uL (ref 0.0–0.1)
Basophils Relative: 0 %
EOS PCT: 1 %
Eosinophils Absolute: 0 10*3/uL (ref 0.0–0.7)
HCT: 44.7 % (ref 36.0–46.0)
Hemoglobin: 14.1 g/dL (ref 12.0–15.0)
LYMPHS ABS: 1.9 10*3/uL (ref 0.7–4.0)
LYMPHS PCT: 29 %
MCH: 31.1 pg (ref 26.0–34.0)
MCHC: 31.5 g/dL (ref 30.0–36.0)
MCV: 98.7 fL (ref 78.0–100.0)
Monocytes Absolute: 0.3 10*3/uL (ref 0.1–1.0)
Monocytes Relative: 5 %
Neutro Abs: 4.2 10*3/uL (ref 1.7–7.7)
Neutrophils Relative %: 65 %
PLATELETS: 184 10*3/uL (ref 150–400)
RBC: 4.53 MIL/uL (ref 3.87–5.11)
RDW: 14.7 % (ref 11.5–15.5)
WBC: 6.4 10*3/uL (ref 4.0–10.5)

## 2018-01-27 LAB — ETHANOL

## 2018-01-27 LAB — SALICYLATE LEVEL: SALICYLATE LVL: 49.5 mg/dL — AB (ref 2.8–30.0)

## 2018-01-27 LAB — ACETAMINOPHEN LEVEL: Acetaminophen (Tylenol), Serum: <10 ug/mL — ABNORMAL LOW (ref 10–30)

## 2018-01-27 MED ORDER — DEXTROSE 5 % IV SOLN
INTRAVENOUS | Status: DC
  Administered 2018-01-28 – 2018-01-29 (×3): via INTRAVENOUS
  Filled 2018-01-27 (×7): qty 150

## 2018-01-27 MED ORDER — ONDANSETRON HCL 4 MG/2ML IJ SOLN: 4.0000 mg | Freq: Three times a day (TID) | INTRAMUSCULAR | Status: DC | PRN

## 2018-01-27 MED ORDER — ENOXAPARIN SODIUM 40 MG/0.4ML ~~LOC~~ SOLN
40.0000 mg | Freq: Every day | SUBCUTANEOUS | Status: DC
  Administered 2018-01-28 – 2018-01-29 (×2): 40 mg via SUBCUTANEOUS
  Filled 2018-01-27 (×3): qty 0.4

## 2018-01-27 MED ORDER — LORAZEPAM 2 MG/ML IJ SOLN
0.5000 mg | Freq: Once | INTRAMUSCULAR | Status: AC
  Administered 2018-01-27: 0.5 mg via INTRAVENOUS
  Filled 2018-01-27: qty 1

## 2018-01-27 MED ORDER — LORAZEPAM 2 MG/ML IJ SOLN
1.0000 mg | Freq: Once | INTRAMUSCULAR | Status: AC
  Administered 2018-01-27: 1 mg via INTRAVENOUS
  Filled 2018-01-27: qty 1

## 2018-01-27 MED ORDER — SODIUM CHLORIDE 0.9 % IV BOLUS
1000.0000 mL | Freq: Once | INTRAVENOUS | Status: AC
  Administered 2018-01-27: 1000 mL via INTRAVENOUS

## 2018-01-27 MED ORDER — ACETAMINOPHEN 650 MG RE SUPP: 650.0000 mg | Freq: Four times a day (QID) | RECTAL | Status: DC | PRN

## 2018-01-27 MED ORDER — NICOTINE 21 MG/24HR TD PT24
21.0000 mg | MEDICATED_PATCH | Freq: Every day | TRANSDERMAL | Status: DC
  Administered 2018-01-28 – 2018-01-30 (×4): 21 mg via TRANSDERMAL
  Filled 2018-01-27 (×4): qty 1

## 2018-01-27 NOTE — ED Triage Notes (Signed)
Per [facility] patient lsn 2 days ago. Workplace called ems and reports patient was staggering around her work since this am and was slow to respond with intermittent slurred speech. Patient reports feeling "foggy headed". Left sided facial droop noted with ems. Patient alert and oriented x 4 on arrival. Denies pain

## 2018-01-27 NOTE — ED Provider Notes (Signed)
[facility] EMERGENCY DEPARTMENT Provider Note   CSN: [id number] Arrival date & time: 01/27/18  1302     History   Chief Complaint Chief Complaint  Patient presents with  . Altered Mental Status    HPI [name] [name] is a 62 y.o. female brought in by EMS for altered mental status.  Patient last seen normal 2 days ago.  Per EMS, patient had been feeling "foggy headed" for the last 2 days.  Patient came into work today and coworkers noticed patient was staggering and seem to have some intermittent slurred speech.  They called EMS.  Patient reports that she "just feels kind of funny."  Patient denies any trauma, injury, fall.  Patient denies any vision changes, chest pain, abdominal pain, difficulty breathing, recent fever, cough, numbness/weakness of her extremities.  The history is provided by the patient.    Past Medical History:  Diagnosis Date  . Cancer [facility])    endometrial ca    Patient Active Problem List   Diagnosis Date Noted  . Ventral hernia 02/22/2014  . Endometrial cancer ([place]) 08/03/2012  . SINUSITIS, CHRONIC NOS 08/12/2007  . SYMPTOM, MALAISE AND FATIGUE NEC 08/12/2007  . DISORDERS, [place] D/T MENTAL DISORDER 05/30/2007  . Myalgia and myositis, unspecified 05/30/2007  . DEPRESSION 04/14/2007    Past Surgical History:  Procedure Laterality Date  . ABDOMINAL HYSTERECTOMY     TAH, BSO, pelvic lymphadenectomy  . CERVICAL CONE BIOPSY    . CHOLECYSTECTOMY       OB History   None      Home Medications    Prior to Admission medications   Medication Sig Start Date End Date Taking? Authorizing Provider  Aspirin-Acetaminophen (GOODY BODY PAIN) 500-325 MG PACK Take by mouth.    [provider]  BIOTIN PO Take by mouth.    [provider]  Calcium Carbonate-Vitamin D (CALCIUM + D PO) Take 2 tablets by mouth daily.    [provider]  Ibuprofen (ADVIL) 200 MG CAPS Take by mouth as needed.    [provider]  magnesium oxide (MAG-OX) 400 MG tablet Take 400 mg by mouth daily.    [provider]  ranitidine (ZANTAC) 150 MG capsule Take 150 mg by mouth as needed.     [provider]  Valerian Root 250 MG CAPS Take 4 capsules by mouth 2 (two) times daily.    [provider]    Family History No family history on file.  Social History Social History   Tobacco Use  . Smoking status: Current Every Day Smoker    Packs/day: 0.50    Types: Cigarettes  . Smokeless tobacco: Never Used  . Tobacco comment: smoking cessation information given  Substance Use Topics  . Alcohol use: Yes    Comment: rarely  . Drug use: No     Allergies   Codeine   Review of Systems Review of Systems  Constitutional: Negative for chills and fever.  HENT: Negative for congestion.   Eyes: Negative for visual disturbance.  Respiratory: Negative for cough and shortness of breath.   Cardiovascular: Negative for chest pain.  Gastrointestinal: Negative for abdominal pain, diarrhea, nausea and vomiting.  Genitourinary: Negative for dysuria and hematuria.  Musculoskeletal: Negative for back pain and neck pain.  Skin: Negative for rash.  Neurological: Positive for facial asymmetry and speech difficulty. Negative for dizziness, weakness, numbness and headaches.  Psychiatric/Behavioral: Positive for confusion.  All other systems reviewed and are negative.  Physical Exam Updated Vital Signs BP (!) 111/59   Pulse 84   Resp (!) 24   Ht 5\' 3"  (1.6 m)   Wt 78.5 kg (173 lb)   SpO2 94%   BMI 30.65 kg/m   Physical Exam  Constitutional: She is oriented to person, place, and time. She appears well-developed and well-nourished.  HENT:  Head: Normocephalic and atraumatic.  Mouth/Throat: Oropharynx is clear and moist and mucous membranes are normal.  Eyes: Pupils are equal, round, and reactive to light. Conjunctivae, EOM and lids are normal.  Neck: Full passive range of  motion without pain.  Cardiovascular: Normal rate, regular rhythm, normal heart sounds and normal pulses. Exam reveals no gallop and no friction rub.  No murmur heard. Pulmonary/Chest: Effort normal and breath sounds normal.  No evidence of respiratory distress. Able to speak in full sentences without difficulty.  Abdominal: Soft. Normal appearance. There is no tenderness. There is no rigidity and no guarding.  Abdomen is soft, non-distended, non-tender.   Musculoskeletal: Normal range of motion.  Neurological: She is alert and oriented to person, place, and time.  Cranial nerves III-XII intact Follows commands, Moves all extremities  5/5 strength to BUE and BLE  Sensation intact throughout all major nerve distributions Normal finger to nose. No dysdiadochokinesia. No pronator drift. No gait abnormalities  No slurred speech.  When cranial nerves are tested patient has no facial asymmetry. When talking, no slurred speech is noted. Occasionally, when the patient is at rest, her left lip appears slightly drooped.  A&O x 3. Answers questions appropriately.   Skin: Skin is warm and dry. Capillary refill takes less than 2 seconds.  Psychiatric: She has a normal mood and affect. Her speech is normal.  Nursing note and vitals reviewed.    ED Treatments / Results  Labs (all labs ordered are listed, but only abnormal results are displayed) Labs Reviewed - No data to display  EKG None  Radiology No results found.  Procedures Procedures (including critical care time)  Medications Ordered in ED Medications - No data to display   Initial Impression / Assessment and Plan / ED Course  I have reviewed the triage vital signs and the nursing notes.  Pertinent labs & imaging results that were available during my care of the patient were reviewed by me and considered in my medical decision making (see chart for details).     62 y.o. F who presents for  AMS. Brought in by EMS after  coworkers stated that patient was staggering and had some intermittent slurred speech. Last seen normal 2 days ago. Reports "feeling just foggy headed." Denies trauma.  Patient is afebrile, non-toxic appearing, sitting comfortably on examination table. Vital signs reviewed and stable. No neuro deficits noted on exam. EMS noted some left sided facial droop. On CN exam, no facial asymmetry noted. No slurred speech noted. Occasionally, while at rest, her lips appears to droop. Patient is A&O x3. She answers questions appropriately. Follows commands. Plan to check basic labs, UA, UDS, Salicylate  Level, ethanol, CT head, CXR.   CBC shows no leukocytosis or anemia. CMP shows bicarb of 15. Cr is 1.21. Anion gap is 16.   Patient signed out to Alyse Low, PA-C with labs pending. Please see her note for further ED course.    Final Clinical Impressions(s) / ED Diagnoses   Final diagnoses:  None    ED Discharge Orders    None       Volanda Napoleon, PA-C  01/27/18 1950    Valarie Merino, MD 02/01/18 1451

## 2018-01-27 NOTE — ED Notes (Signed)
Patient transported to MRI 

## 2018-01-27 NOTE — Consult Note (Signed)
Reason for Consult: Salicylate poisoning Referring Physician:  Dr. Blaine Hamper  Chief Complaint: Altered mental status  Assessment/Plan: 1. Acute Kidney Injury vs CKD - pt with a creatinine of 0.53 in 06/2011 and then 0.8 in 07/2015; will be difficult to differentiate acuity without outside labs to help trend.  - Aggressively hydrate with HCO3 based isotonic fluids as you are already doing to alkalinize the urine and aid in removing the salicylate metabolites in the urine. Pt is not overloaded and sats are more than adequate; she appears to be tolerating. - Will need to check serial salicylate levels as they can increase up to 30hrs after ingestion; she is altered but this is not an absolute indication for dialysis. If there is an increasing trend to the salicylate level + AMS then we will dialyze. - Low threshold to dialyze but with no absolute indication we will follow closely and check the salicylate level trending unless her condition worsens. 2. AMS - likely secondary to the salicylate intoxication. 3. Migraines    HPI: [name] [name] is an 62 y.o. female brought in by EMS for altered mental status but noted to be acting out of character at her exercise group last Thursday.  Pt missed work this past week since Monday and then after going to work today a Mudlogger called EMS bec of slurred speech. Hallucinations have also been reported by the daughter. She was given ativan to undergo a MRI in the ED -> limited exam but no e/o CVA.  Per EMS, patient had been feeling "foggy headed" for the last 2 days.   Patient reports that she "just feels kind of funny."  Patient denies any trauma, injury, fall.  Patient denies any vision changes, chest pain, abdominal pain, difficulty breathing, recent fever, cough, numbness/weakness of her extremities. She was noted to have a salicylate level of 49 in the ED.    ROS Review of systems not obtained due to patient factors.  Chemistry and CBC: Creatinine  Date/Time  Value Ref Range Status  08/09/2015 10:48 AM 0.8 0.6 - 1.1 mg/dL Final   Creatinine, Ser  Date/Time Value Ref Range Status  01/27/2018 01:57 PM 1.21 (H) 0.44 - 1.00 mg/dL Final  07/15/2011 04:30 AM 0.53 0.50 - 1.10 mg/dL Final  07/13/2011 09:09 AM 0.53 0.50 - 1.10 mg/dL Final  07/12/2011 01:00 PM 0.56 0.50 - 1.10 mg/dL Final  07/11/2011 01:00 PM 0.62 0.50 - 1.10 mg/dL Final  07/08/2011 03:55 AM 0.71 0.50 - 1.10 mg/dL Final  07/03/2011 10:40 AM 0.59 0.50 - 1.10 mg/dL Final  05/01/2011 10:25 AM 0.66 0.50 - 1.10 mg/dL Final    Comment:    **Please note change in reference range.**  10/22/2006 03:24 PM 0.8 0.4 - 1.2 mg/dL Final  09/07/2006 02:20 PM 0.7 0.4 - 1.2 mg/dL Final   Recent Labs  Lab 01/27/18 1357  NA 139  K 4.6  CL 108  CO2 15*  GLUCOSE 101*  BUN 17  CREATININE 1.21*  CALCIUM 8.4*   Recent Labs  Lab 01/27/18 1357  WBC 6.4  NEUTROABS 4.2  HGB 14.1  HCT 44.7  MCV 98.7  PLT 184   Liver Function Tests: Recent Labs  Lab 01/27/18 1357  AST 29  ALT 11*  ALKPHOS 54  BILITOT 0.8  PROT 7.0  ALBUMIN 3.8   No results for input(s): LIPASE, AMYLASE in the last 168 hours. No results for input(s): AMMONIA in the last 168 hours. Cardiac Enzymes: No results for input(s): CKTOTAL, CKMB, CKMBINDEX, TROPONINI  in the last 168 hours. Iron Studies: No results for input(s): IRON, TIBC, TRANSFERRIN, FERRITIN in the last 72 hours. PT/INR: _0 (inr:5)  Xrays/Other Studies: ) Results for orders placed or performed during the hospital encounter of 01/27/18 (from the past 48 hour(s))  Comprehensive metabolic panel     Status: Abnormal   Collection Time: 01/27/18  1:57 PM  Result Value Ref Range   Sodium 139 135 - 145 mmol/L   Potassium 4.6 3.5 - 5.1 mmol/L    Comment: HEMOLYSIS AT THIS LEVEL MAY AFFECT RESULT   Chloride 108 101 - 111 mmol/L   CO2 15 (L) 22 - 32 mmol/L   Glucose, Bld 101 (H) 65 - 99 mg/dL   BUN 17 6 - 20 mg/dL   Creatinine, Ser 1.21 (H) 0.44 - 1.00  mg/dL   Calcium 8.4 (L) 8.9 - 10.3 mg/dL   Total Protein 7.0 6.5 - 8.1 g/dL   Albumin 3.8 3.5 - 5.0 g/dL   AST 29 15 - 41 U/L   ALT 11 (L) 14 - 54 U/L   Alkaline Phosphatase 54 38 - 126 U/L   Total Bilirubin 0.8 0.3 - 1.2 mg/dL   GFR calc non Af Amer 47 (L) >60 mL/min   GFR calc Af Amer 54 (L) >60 mL/min    Comment: (NOTE) The eGFR has been calculated using the CKD EPI equation. This calculation has not been validated in all clinical situations. eGFR's persistently <60 mL/min signify possible Chronic Kidney Disease.    Anion gap 16 (H) 5 - 15    Comment: Performed at [facility], [place] [address], [place], [place] [address]  CBC with Differential     Status: None   Collection Time: 01/27/18  1:57 PM  Result Value Ref Range   WBC 6.4 4.0 - 10.5 K/uL   RBC 4.53 3.87 - 5.11 MIL/uL   Hemoglobin 14.1 12.0 - 15.0 g/dL   HCT 44.7 36.0 - 46.0 %   MCV 98.7 78.0 - 100.0 fL   MCH 31.1 26.0 - 34.0 pg   MCHC 31.5 30.0 - 36.0 g/dL   RDW 14.7 11.5 - 15.5 %   Platelets 184 150 - 400 K/uL   Neutrophils Relative % 65 %   Neutro Abs 4.2 1.7 - 7.7 K/uL   Lymphocytes Relative 29 %   Lymphs Abs 1.9 0.7 - 4.0 K/uL   Monocytes Relative 5 %   Monocytes Absolute 0.3 0.1 - 1.0 K/uL   Eosinophils Relative 1 %   Eosinophils Absolute 0.0 0.0 - 0.7 K/uL   Basophils Relative 0 %   Basophils Absolute 0.0 0.0 - 0.1 K/uL    Comment: Performed at [facility], [place] [address] [place], [place] [address]  Ethanol     Status: None   Collection Time: 01/27/18  4:43 PM  Result Value Ref Range   Alcohol, Ethyl (B) <10 <10 mg/dL    Comment:        LOWEST DETECTABLE LIMIT FOR SERUM ALCOHOL IS 10 mg/dL FOR MEDICAL PURPOSES ONLY Performed at [facility], [place] [address], [place], [place] [address]   Salicylate level     Status: Abnormal   Collection Time: 01/27/18  4:43 PM  Result Value Ref Range   Salicylate Lvl 04.8 (HH) 2.8 - 30.0 mg/dL    Comment: CRITICAL RESULT CALLED TO, READ BACK BY  AND VERIFIED WITH: Rudean Haskell 1938 01/27/2018 WBOND Performed at [facility], [place],  [place] [address]   Acetaminophen level     Status: Abnormal   Collection Time: 01/27/18  4:43 PM  Result Value Ref Range   Acetaminophen (Tylenol), Serum <10 (L) 10 - 30 ug/mL    Comment:        THERAPEUTIC CONCENTRATIONS VARY SIGNIFICANTLY. A RANGE OF 10-30 ug/mL MAY BE AN EFFECTIVE CONCENTRATION FOR MANY PATIENTS. HOWEVER, SOME ARE BEST TREATED AT CONCENTRATIONS OUTSIDE THIS RANGE. ACETAMINOPHEN CONCENTRATIONS >150 ug/mL AT 4 HOURS AFTER INGESTION AND >50 ug/mL AT 12 HOURS AFTER INGESTION ARE OFTEN ASSOCIATED WITH TOXIC REACTIONS. Performed at [facility], [place] [address], [place], [place] [address]   Urinalysis, Routine w reflex microscopic     Status: Abnormal   Collection Time: 01/27/18  7:40 PM  Result Value Ref Range   Color, Urine YELLOW YELLOW   APPearance CLEAR CLEAR   Specific Gravity, Urine 1.013 1.005 - 1.030   pH 5.0 5.0 - 8.0   Glucose, UA NEGATIVE NEGATIVE mg/dL   Hgb urine dipstick MODERATE (A) NEGATIVE   Bilirubin Urine NEGATIVE NEGATIVE   Ketones, ur 20 (A) NEGATIVE mg/dL   Protein, ur NEGATIVE NEGATIVE mg/dL   Nitrite NEGATIVE NEGATIVE   Leukocytes, UA NEGATIVE NEGATIVE   RBC / HPF 6-30 0 - 5 RBC/hpf   WBC, UA 0-5 0 - 5 WBC/hpf   Bacteria, UA RARE (A) NONE SEEN   Squamous Epithelial / LPF NONE SEEN NONE SEEN   Mucus PRESENT    Hyaline Casts, UA PRESENT     Comment: Performed at [facility] [address] [place], [place] [address]   Dg Chest 2 View  Result Date: 01/27/2018 CLINICAL DATA:  Possible stroke. EXAM: CHEST - 2 VIEW COMPARISON:  Chest x-ray dated April 08, 2011. FINDINGS: Borderline cardiomegaly. Normal pulmonary vascularity. Mild central bronchitic changes, likely smoking-related. No focal consolidation, pleural effusion, or pneumothorax. No acute osseous abnormality. IMPRESSION: No active cardiopulmonary  disease. Electronically Signed   By: Titus Dubin M.D.   On: 01/27/2018 14:28   Ct Head Wo Contrast  Result Date: 01/27/2018 CLINICAL DATA:  Acute mental status change and slurred speech. Left-sided facial droop. EXAM: CT HEAD WITHOUT CONTRAST TECHNIQUE: Contiguous axial images were obtained from the base of the skull through the vertex without intravenous contrast. COMPARISON:  None. FINDINGS: Brain: No evidence of acute infarction, hemorrhage, hydrocephalus, extra-axial collection or mass lesion/mass effect. Vascular: No hyperdense vessel or unexpected calcification. Skull: Normal. Negative for fracture or focal lesion. Sinuses/Orbits: No acute finding. Other: None. IMPRESSION: No cause for the patient's symptoms identified. No acute abnormalities. Electronically Signed   By: Dorise Bullion III M.D   On: 01/27/2018 14:41   Mr Brain Wo Contrast  Result Date: 01/27/2018 CLINICAL DATA:  Initial evaluation for possible stroke, altered mental status. EXAM: MRI HEAD WITHOUT CONTRAST TECHNIQUE: Multiplanar, multiecho pulse sequences of the brain and surrounding structures were obtained without intravenous contrast. COMPARISON:  Prior CT from earlier same day. FINDINGS: Brain: Examination is limited as the patient was unable to tolerate the full length of the exam, diffusion-weighted sequences as well as axial FLAIR and gradient echo sequences only were performed. Additionally, images provided are degraded by motion. Diffusion-weighted imaging demonstrates no evidence for acute or subacute ischemia. Gray-Giambra matter differentiation maintained. No obvious areas of remote infarction. No acute or chronic intracranial hemorrhage. No appreciable mass lesion. No mass effect or midline shift. No hydrocephalus. No extra-axial fluid collection. Major dural sinuses are grossly patent. Vascular: Major intracranial vascular flow  voids are grossly maintained at the skull base. Skull and upper cervical spine:  Craniocervical junction not well evaluated on this limited exam. Calvarium grossly intact and normal. No scalp soft tissue abnormality. Sinuses/Orbits: Globes normal soft tissues grossly within normal limits. Paranasal sinuses are largely clear. No appreciable mastoid effusion. Other: None. IMPRESSION: 1. Limited exam due to patient's inability to tolerate the full length of the study and motion artifact. 2. No acute intracranial infarct. No other definite acute intracranial abnormality identified. Electronically Signed   By: Jeannine Boga M.D.   On: 01/27/2018 21:52    PMH:   Past Medical History:  Diagnosis Date  . Cancer [facility])    endometrial ca    PSH:   Past Surgical History:  Procedure Laterality Date  . ABDOMINAL HYSTERECTOMY     TAH, BSO, pelvic lymphadenectomy  . CERVICAL CONE BIOPSY    . CHOLECYSTECTOMY      Allergies:  Allergies  Allergen Reactions  . Guaifenesin Er Palpitations  . Codeine     hyper    Medications:   Prior to Admission medications   Medication Sig Start Date End Date Taking? Authorizing Provider  Ibuprofen (ADVIL) 200 MG CAPS Take 400 mg by mouth as needed.    Yes [provider]  SUMAtriptan Succinate (IMITREX PO) Take 1 tablet by mouth as needed.   Yes [provider]    Discontinued Meds:   Medications Discontinued During This Encounter  Medication Reason  . Aspirin-Acetaminophen (GOODY BODY PAIN) 500-325 MG PACK Patient Preference  . Calcium Carbonate-Vitamin D (CALCIUM + D PO) Patient Preference  . magnesium oxide (MAG-OX) 400 MG tablet Patient Preference  . ranitidine (ZANTAC) 150 MG capsule Patient Preference  . Valerian Root 250 MG CAPS Patient Preference  . BIOTIN PO Patient Preference    Social History:  reports that she has been smoking cigarettes.  She has been smoking about 0.50 packs per day. She has never used smokeless tobacco. She reports that she drinks alcohol. She reports that she does not use  drugs.  Family History:  No family history on file.  Blood pressure (!) 112/52, pulse 71, resp. rate 17, height _0  (1.6 m), weight 78.5 kg (173 lb), SpO2 97 %. General appearance: appears stated age, no distress and slowed mentation Head: Normocephalic, without obvious abnormality, atraumatic Eyes: negative Neck: no adenopathy, no carotid bruit, no JVD, supple, symmetrical, trachea midline and thyroid not enlarged, symmetric, no tenderness/mass/nodules Back: symmetric, no curvature. ROM normal. No CVA tenderness. Resp: bronchophony bibasilar Chest wall: no tenderness Cardio: regular rate and rhythm, S1, S2 normal, no murmur, click, rub or gallop GI: soft, non-tender; bowel sounds normal; no masses,  no organomegaly Extremities: extremities normal, atraumatic, no cyanosis or edema Pulses: 2+ and symmetric Skin: Skin color, texture, turgor normal. No rashes or lesions Lymph nodes: Cervical, supraclavicular, and axillary nodes normal. Neurologic: Mental status: alertness: lethargic       Gimena Buick, Hunt Oris, MD 01/27/2018, 11:51 PM

## 2018-01-27 NOTE — ED Provider Notes (Addendum)
Family reports pt was acting abnormal last Thursday at her exercise group.  Pt's daughter has been out of town.  She reports her Mother is a Marine scientist who lives alone and works at [employer].  Pt missed work on mon, tue and wed.  Pt went to work today and coworker called Ems because pt had slurred speech.  Daughter reports pt seems to have been having hallucinations.  She reports pt was putting Ekg leads in her mouth.   Pt sent for MRi and could not be still.   Pt given ativan in order to obtain MRi.   Mri studies do not show cva.     I reviewed pt's labs.  Pt has elevated salicylate level.  Pt reported to have been taking goody powders.  I will repeat level. I spoke with Dr. Blaine Hamper Hospitalist who will admit.     Dr. Augustin Coupe Nephrology consulted.   He will see in am  Sidney Ace 01/27/18 [address], PA-C 01/27/18 2302    Tegeler, Gwenyth Allegra, MD 01/28/18 [phone]

## 2018-01-27 NOTE — H&P (Signed)
History and Physical    [name] [name] CNO:[id number] DOB: 1956/01/18 DOA: 01/27/2018  Referring MD/NP/PA:   PCP: Patient, No Pcp Per   Patient coming from:  The patient is coming from home.  At baseline, pt is independent for most of ADL.   Chief Complaint: AMS  HPI: [name] [name] is a 62 y.o. female with medical history significant of depression, endometrial cancer, tobacco abuse, who presents with altered mental status.  Patient has AMS and is unable to provide accurate medical history, therefore, most of the history is obtained by discussing the case with ED physician, per EMS report, and with the nursing staff.  Per EDP, family reports pt was acting abnormal last Thursday at her exercise group. 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 She was found by coworker to have confusion and slurred speech.  Daughter reports to EDP that pt seems to have been having hallucinations.  pt was noted to put Ekg leads in her mouth. When I saw pt in ED, she is confused. She is very sleepy partially because that patient received Ativan for MRI in ED. She is arousable. She she knows her own name and oriented to place, but not to time. Patient does not have active respiratory distress, cough, nausea, vomiting or diarrhea noted. She moves all extremities. No facial droop. Her talking is not very clear.   ED Course: pt was found to have salicylate level 29.4, tylenol<10, negative urinalysis, alcohol less than 10, acute renal injury with creatinine 1.21 and BUN 17, bicarbonate 15, no fever, no tachycardia, oxygen saturation section 97% on room air. CT head is negative for acute intracranial abnormalities. MRI of brain is limited study, but did not show stroke. Patient is admitted to stepdown as inp. 3. Theseatient. Nephrology, Dr. Augustin Coupe was consulted.  Review of Systems: could not be reviewed due to altered mental status.  Allergy:  Allergies  Allergen Reactions  . Guaifenesin Er  Palpitations  . Codeine     hyper    Past Medical History:  Diagnosis Date  . Cancer [facility])    endometrial ca    Past Surgical History:  Procedure Laterality Date  . ABDOMINAL HYSTERECTOMY     TAH, BSO, pelvic lymphadenectomy  . CERVICAL CONE BIOPSY    . CHOLECYSTECTOMY      Social History:  reports that she has been smoking cigarettes.  She has been smoking about 0.50 packs per day. She has never used smokeless tobacco. She reports that she drinks alcohol. She reports that she does not use drugs.  Family History: could not be reviewed due to altered mental status.  Prior to Admission medications   Medication Sig Start Date End Date Taking? Authorizing Provider  Ibuprofen (ADVIL) 200 MG CAPS Take 400 mg by mouth as needed.    Yes [provider]  SUMAtriptan Succinate (IMITREX PO) Take 1 tablet by mouth as needed.   Yes [provider]    Physical Exam: Vitals:   01/28/18 0000 01/28/18 0030 01/28/18 0100 01/28/18 0130  BP: (!) 110/59 (!) 119/58 (!) 114/55 111/60  Pulse: 66 62 64 64  Resp: 20 16 17 16   SpO2: 98% 96% 97% 96%  Weight:      Height:       General: Not in acute distress HEENT:       Eyes: PERRL, EOMI, no scleral icterus.       ENT: No discharge from the ears and nose,  no pharynx injection, no tonsillar enlargement.        Neck: No JVD, no bruit, no mass felt. Heme: No neck lymph node enlargement. Cardiac: S1/S2, RRR, No murmurs, No gallops or rubs. Respiratory:  No rales, wheezing, rhonchi or rubs. GI: Soft, nondistended, nontender, no organomegaly, BS present. GU: No hematuria Ext: No pitting leg edema bilaterally. 2+DP/PT pulse bilaterally. Musculoskeletal: No joint deformities, No joint redness or warmth, no limitation of ROM in spin. Skin: No rashes.  Neuro: confused, sleepy, arousable, knows her own name, is oriented to place, but not to time, cranial nerves II-XII grossly intact, moves all extremities normally.  Psych: Patient is  not psychotic, no suicidal or hemocidal ideation.  Labs on Admission: I have personally reviewed following labs and imaging studies  CBC: Recent Labs  Lab 01/27/18 1357  WBC 6.4  NEUTROABS 4.2  HGB 14.1  HCT 44.7  MCV 98.7  PLT 099   Basic Metabolic Panel: Recent Labs  Lab 01/27/18 1357  NA 139  K 4.6  CL 108  CO2 15*  GLUCOSE 101*  BUN 17  CREATININE 1.21*  CALCIUM 8.4*   GFR: Estimated Creatinine Clearance: 47.2 mL/min (A) (by C-G formula based on SCr of 1.21 mg/dL (H)). Liver Function Tests: Recent Labs  Lab 01/27/18 1357  AST 29  ALT 11*  ALKPHOS 54  BILITOT 0.8  PROT 7.0  ALBUMIN 3.8   No results for input(s): LIPASE, AMYLASE in the last 168 hours. Recent Labs  Lab 01/28/18 0018  AMMONIA 41*   Coagulation Profile: No results for input(s): INR, PROTIME in the last 168 hours. Cardiac Enzymes: No results for input(s): CKTOTAL, CKMB, CKMBINDEX, TROPONINI in the last 168 hours. BNP (last 3 results) No results for input(s): PROBNP in the last 8760 hours. HbA1C: No results for input(s): HGBA1C in the last 72 hours. CBG: No results for input(s): GLUCAP in the last 168 hours. Lipid Profile: No results for input(s): CHOL, HDL, LDLCALC, TRIG, CHOLHDL, LDLDIRECT in the last 72 hours. Thyroid Function Tests: No results for input(s): TSH, T4TOTAL, FREET4, T3FREE, THYROIDAB in the last 72 hours. Anemia Panel: No results for input(s): VITAMINB12, FOLATE, FERRITIN, TIBC, IRON, RETICCTPCT in the last 72 hours. Urine analysis:    Component Value Date/Time   COLORURINE YELLOW 01/27/2018 1940   APPEARANCEUR CLEAR 01/27/2018 1940   LABSPEC 1.013 01/27/2018 1940   PHURINE 5.0 01/27/2018 1940   GLUCOSEU NEGATIVE 01/27/2018 1940   HGBUR MODERATE (A) 01/27/2018 1940   BILIRUBINUR NEGATIVE 01/27/2018 1940   KETONESUR 20 (A) 01/27/2018 1940   PROTEINUR NEGATIVE 01/27/2018 1940   UROBILINOGEN 0.2 03/05/2011 1200   NITRITE NEGATIVE 01/27/2018 1940   LEUKOCYTESUR  NEGATIVE 01/27/2018 1940   Sepsis Labs: @LABRCNTIP (procalcitonin:4,lacticidven:4) )No results found for this or any previous visit (from the past 240 hour(s)).   Radiological Exams on Admission: Dg Chest 2 View  Result Date: 01/27/2018 CLINICAL DATA:  Possible stroke. EXAM: CHEST - 2 VIEW COMPARISON:  Chest x-ray dated April 08, 2011. FINDINGS: Borderline cardiomegaly. Normal pulmonary vascularity. Mild central bronchitic changes, likely smoking-related. No focal consolidation, pleural effusion, or pneumothorax. No acute osseous abnormality. IMPRESSION: No active cardiopulmonary disease. Electronically Signed   By: Titus Dubin M.D.   On: 01/27/2018 14:28   Ct Head Wo Contrast  Result Date: 01/27/2018 CLINICAL DATA:  Acute mental status change and slurred speech. Left-sided facial droop. EXAM: CT HEAD WITHOUT CONTRAST TECHNIQUE: Contiguous axial images were obtained from the base of the skull through the vertex without intravenous  contrast. COMPARISON:  None. FINDINGS: Brain: No evidence of acute infarction, hemorrhage, hydrocephalus, extra-axial collection or mass lesion/mass effect. Vascular: No hyperdense vessel or unexpected calcification. Skull: Normal. Negative for fracture or focal lesion. Sinuses/Orbits: No acute finding. Other: None. IMPRESSION: No cause for the patient's symptoms identified. No acute abnormalities. Electronically Signed   By: Dorise Bullion III M.D   On: 01/27/2018 14:41   Mr Brain Wo Contrast  Result Date: 01/27/2018 CLINICAL DATA:  Initial evaluation for possible stroke, altered mental status. EXAM: MRI HEAD WITHOUT CONTRAST TECHNIQUE: Multiplanar, multiecho pulse sequences of the brain and surrounding structures were obtained without intravenous contrast. COMPARISON:  Prior CT from earlier same day. FINDINGS: Brain: Examination is limited as the patient was unable to tolerate the full length of the exam, diffusion-weighted sequences as well as axial FLAIR and  gradient echo sequences only were performed. Additionally, images provided are degraded by motion. Diffusion-weighted imaging demonstrates no evidence for acute or subacute ischemia. Gray-Kristiansen matter differentiation maintained. No obvious areas of remote infarction. No acute or chronic intracranial hemorrhage. No appreciable mass lesion. No mass effect or midline shift. No hydrocephalus. No extra-axial fluid collection. Major dural sinuses are grossly patent. Vascular: Major intracranial vascular flow voids are grossly maintained at the skull base. Skull and upper cervical spine: Craniocervical junction not well evaluated on this limited exam. Calvarium grossly intact and normal. No scalp soft tissue abnormality. Sinuses/Orbits: Globes normal soft tissues grossly within normal limits. Paranasal sinuses are largely clear. No appreciable mastoid effusion. Other: None. IMPRESSION: 1. Limited exam due to patient's inability to tolerate the full length of the study and motion artifact. 2. No acute intracranial infarct. No other definite acute intracranial abnormality identified. Electronically Signed   By: Jeannine Boga M.D.   On: 01/27/2018 21:52     EKG: Independently reviewed.  Sinus rhythm, QTC 423, no voltage, poor R-wave progression, nonspecific T-wave change.  Assessment/Plan Principal Problem:   Salicylate intoxication Active Problems:   Tobacco abuse   Acute metabolic encephalopathy   AKI (acute kidney injury) ([place])   Metabolic acidosis   Salicylate intoxication: pt states that she takes [place] power and ASA at home.  -will admit to SUD as inpt - IV D5W + 150 mEq HCO3 @ 125 ml/hr for now.  - Target urine 7.5-8.0. - monitor BMP q6h to HCO3 level and UA for urine pH q6h - renal, Dr. Augustin Coupe was consulted by EDP  Acute metabolic encephalopathy: CT-head and MRI negative (MRI is limited study). Likely due to salicylate intoxication. Her ammonia level is mildly elevated at 41. --frequent  neuro checks -Start lactulose per rectal 20 g twice a day  Tobacco abuse -nicotine patch  AKI: mild with cre 1.21 and BUN 17. Likely due to prerenal secondary to dehydration - IVF: 1L NS was given in ED, now on  D5W + 150 mEq HCO3 @ 125 ml/hr   - Check FeNa  - Follow up renal function by BMP  Metabolic acidosis: 2/2 to salicylate intoxication:  - on D5W + 150 mEq HCO3 @ 125 ml/hr for now.   DVT ppx:  SQ Lovenox Code Status: Full code Family Communication: None at bed side.     Disposition Plan:  Anticipate discharge back to previous home environment Consults called:  Renal, Dr. Augustin Coupe Admission status:   SDU/inpation       Date of Service 01/28/2018    Ivor Costa [facility] Pager [phone]  If 7PM-7AM, please contact night-coverage [email or web address] Password [facility] 01/28/2018, 2:32 AM

## 2018-01-28 ENCOUNTER — Encounter (HOSPITAL_COMMUNITY): Payer: Self-pay

## 2018-01-28 ENCOUNTER — Other Ambulatory Visit: Payer: Self-pay

## 2018-01-28 DIAGNOSIS — N179 Acute kidney failure, unspecified: Secondary | ICD-10-CM

## 2018-01-28 DIAGNOSIS — E872 Acidosis: Secondary | ICD-10-CM

## 2018-01-28 DIAGNOSIS — T39091A Poisoning by salicylates, accidental (unintentional), initial encounter: Secondary | ICD-10-CM

## 2018-01-28 DIAGNOSIS — R4182 Altered mental status, unspecified: Secondary | ICD-10-CM

## 2018-01-28 LAB — BASIC METABOLIC PANEL
ANION GAP: 12 (ref 5–15)
ANION GAP: 11 (ref 5–15)
ANION GAP: 10 (ref 5–15)
Anion gap: 12 (ref 5–15)
BUN: 17 mg/dL (ref 6–20)
BUN: 16 mg/dL (ref 6–20)
BUN: 15 mg/dL (ref 6–20)
BUN: 13 mg/dL (ref 6–20)
CALCIUM: 8.2 mg/dL — AB (ref 8.9–10.3)
CHLORIDE: 104 mmol/L (ref 101–111)
CO2: 20 mmol/L — AB (ref 22–32)
CO2: 22 mmol/L (ref 22–32)
CO2: 24 mmol/L (ref 22–32)
CO2: 28 mmol/L (ref 22–32)
Calcium: 8 mg/dL — ABNORMAL LOW (ref 8.9–10.3)
Calcium: 8.2 mg/dL — ABNORMAL LOW (ref 8.9–10.3)
Calcium: 8.4 mg/dL — ABNORMAL LOW (ref 8.9–10.3)
Chloride: 111 mmol/L (ref 101–111)
Chloride: 107 mmol/L (ref 101–111)
Chloride: 106 mmol/L (ref 101–111)
Creatinine, Ser: 0.9 mg/dL (ref 0.44–1.00)
Creatinine, Ser: 0.81 mg/dL (ref 0.44–1.00)
Creatinine, Ser: 0.75 mg/dL (ref 0.44–1.00)
Creatinine, Ser: 0.75 mg/dL (ref 0.44–1.00)
GFR calc non Af Amer: >60 mL/min (ref >60)
GFR calc non Af Amer: >60 mL/min (ref >60)
GFR calc non Af Amer: >60 mL/min (ref >60)
GFR calc non Af Amer: >60 mL/min (ref >60)
GLUCOSE: 88 mg/dL (ref 65–99)
Glucose, Bld: 88 mg/dL (ref 65–99)
Glucose, Bld: 94 mg/dL (ref 65–99)
Glucose, Bld: 123 mg/dL — ABNORMAL HIGH (ref 65–99)
POTASSIUM: 3.1 mmol/L — AB (ref 3.5–5.1)
POTASSIUM: 2.8 mmol/L — AB (ref 3.5–5.1)
Potassium: 3.5 mmol/L (ref 3.5–5.1)
Potassium: 3.4 mmol/L — ABNORMAL LOW (ref 3.5–5.1)
Sodium: 143 mmol/L (ref 135–145)
Sodium: 141 mmol/L (ref 135–145)
Sodium: 141 mmol/L (ref 135–145)
Sodium: 142 mmol/L (ref 135–145)

## 2018-01-28 LAB — CBC
HEMATOCRIT: 41.6 % (ref 36.0–46.0)
HEMOGLOBIN: 12.9 g/dL (ref 12.0–15.0)
MCH: 30.8 pg (ref 26.0–34.0)
MCHC: 31 g/dL (ref 30.0–36.0)
MCV: 99.3 fL (ref 78.0–100.0)
Platelets: 163 10*3/uL (ref 150–400)
RBC: 4.19 MIL/uL (ref 3.87–5.11)
RDW: 14.8 % (ref 11.5–15.5)
WBC: 5.4 10*3/uL (ref 4.0–10.5)

## 2018-01-28 LAB — SALICYLATE LEVEL
SALICYLATE LVL: 37.7 mg/dL — AB (ref 2.8–30.0)
Salicylate Lvl: 20.5 mg/dL (ref 2.8–30.0)

## 2018-01-28 LAB — GLUCOSE, CAPILLARY: Glucose-Capillary: 95 mg/dL (ref 65–99)

## 2018-01-28 LAB — HIV ANTIBODY (ROUTINE TESTING W REFLEX): HIV SCREEN 4TH GENERATION: NONREACTIVE

## 2018-01-28 LAB — AMMONIA: AMMONIA: 41 umol/L — AB (ref 9–35)

## 2018-01-28 MED ORDER — POTASSIUM CHLORIDE 10 MEQ/100ML IV SOLN
10.0000 meq | INTRAVENOUS | Status: AC
  Administered 2018-01-28 – 2018-01-29 (×4): 10 meq via INTRAVENOUS
  Filled 2018-01-28 (×4): qty 100

## 2018-01-28 MED ORDER — LACTULOSE ENEMA
300.0000 mL | Freq: Two times a day (BID) | ORAL | Status: DC
  Filled 2018-01-28: qty 300

## 2018-01-28 MED ORDER — POTASSIUM CHLORIDE CRYS ER 20 MEQ PO TBCR
40.0000 meq | EXTENDED_RELEASE_TABLET | Freq: Once | ORAL | Status: AC
  Administered 2018-01-28: 40 meq via ORAL
  Filled 2018-01-28: qty 2

## 2018-01-28 MED ORDER — PNEUMOCOCCAL VAC POLYVALENT 25 MCG/0.5ML IJ INJ
0.5000 mL | INJECTION | INTRAMUSCULAR | Status: AC
  Administered 2018-01-29: 0.5 mL via INTRAMUSCULAR
  Filled 2018-01-28: qty 0.5

## 2018-01-28 NOTE — Progress Notes (Signed)
Patient ID: [name] [name], female   DOB: 02/19/1956, 62 y.o.   MRN: [id number] [facility] Progress Note   Assessment/ Plan:   1. AKI: Possibly hemodynamically mediated with improvement noted overnight with intravenous fluids.  We discussed the long-term risk of NSAID ingestion and encouraged her to discontinue this. 2.  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  We will continue to trend salicylate levels for possible rebound and discussed abstinence from [place] powders.  Subjective:   Reports to be feeling somewhat better-"head not buzzing but she feels shaky".   Objective:   BP (!) 118/59 (BP Location: Left Arm)   Pulse (!) 51   Temp 98.2 F (36.8 C) (Oral)   Resp 17   Ht 5\' 3"  (1.6 m)   Wt 78.5 kg (173 lb)   SpO2 92%   BMI 30.65 kg/m   Intake/Output Summary (Last 24 hours) at 01/28/2018 1009 Last data filed at 01/27/2018 1810 Gross per 24 hour  Intake 1000 ml  Output -  Net 1000 ml   Weight change:   Physical Exam: Gen: Comfortably resting in bed, sister at bedside CVS: Pulse regular rhythm, normal rate, S1 and S2 normal Resp: Clear to auscultation, no rales/rhonchi Abd: Soft, flat, nontender Ext: Trace right ankle edema, no edema left leg.  Imaging: Dg Chest 2 View  Result Date: 01/27/2018 CLINICAL DATA:  Possible stroke. EXAM: CHEST - 2 VIEW COMPARISON:  Chest x-ray dated April 08, 2011. FINDINGS: Borderline cardiomegaly. Normal pulmonary vascularity. Mild central bronchitic changes, likely smoking-related. No focal consolidation, pleural effusion, or pneumothorax. No acute osseous abnormality. IMPRESSION: No active cardiopulmonary disease. Electronically Signed   By: Titus Dubin M.D.   On: 01/27/2018 14:28   Ct Head Wo Contrast  Result Date: 01/27/2018 CLINICAL DATA:  Acute mental status change and slurred speech.  Left-sided facial droop. EXAM: CT HEAD WITHOUT CONTRAST TECHNIQUE: Contiguous axial images were obtained from the base of the skull through the vertex without intravenous contrast. COMPARISON:  None. FINDINGS: Brain: No evidence of acute infarction, hemorrhage, hydrocephalus, extra-axial collection or mass lesion/mass effect. Vascular: No hyperdense vessel or unexpected calcification. Skull: Normal. Negative for fracture or focal lesion. Sinuses/Orbits: No acute finding. Other: None. IMPRESSION: No cause for the patient's symptoms identified. No acute abnormalities. Electronically Signed   By: Dorise Bullion III M.D   On: 01/27/2018 14:41   Mr Brain Wo Contrast  Result Date: 01/27/2018 CLINICAL DATA:  Initial evaluation for possible stroke, altered mental status. EXAM: MRI HEAD WITHOUT CONTRAST TECHNIQUE: Multiplanar, multiecho pulse sequences of the brain and surrounding structures were obtained without intravenous contrast. COMPARISON:  Prior CT from earlier same day. FINDINGS: Brain: Examination is limited as the patient was unable to tolerate the full length of the exam, diffusion-weighted sequences as well as axial FLAIR and gradient echo sequences only were performed. Additionally, images provided are degraded by motion. Diffusion-weighted imaging demonstrates no evidence for acute or subacute ischemia. Gray-Camuso matter differentiation maintained. No obvious areas of remote infarction. No acute or chronic intracranial hemorrhage. No appreciable mass lesion. No mass effect or midline shift. No hydrocephalus. No extra-axial fluid collection. Major dural sinuses are grossly patent. Vascular: Major intracranial vascular flow voids are grossly maintained at the skull base. Skull and upper cervical spine: Craniocervical junction not well evaluated on this limited exam. Calvarium grossly intact and normal. No scalp soft tissue abnormality. Sinuses/Orbits: Globes normal soft tissues  grossly within normal limits.  Paranasal sinuses are largely clear. No appreciable mastoid effusion. Other: None. IMPRESSION: 1. Limited exam due to patient's inability to tolerate the full length of the study and motion artifact. 2. No acute intracranial infarct. No other definite acute intracranial abnormality identified. Electronically Signed   By: Jeannine Boga M.D.   On: 01/27/2018 21:52    Labs: BMET Recent Labs  Lab 01/27/18 1357 01/28/18 0407  NA 139 143  K 4.6 3.5  CL 108 111  CO2 15* 20*  GLUCOSE 101* 88  BUN 17 17  CREATININE 1.21* 0.90  CALCIUM 8.4* 8.0*   CBC Recent Labs  Lab 01/27/18 1357 01/28/18 0407  WBC 6.4 5.4  NEUTROABS 4.2  --   HGB 14.1 12.9  HCT 44.7 41.6  MCV 98.7 99.3  PLT 184 163   Medications:    . enoxaparin (LOVENOX) injection  40 mg Subcutaneous Daily  . nicotine  21 mg Transdermal Daily   Elmarie Shiley, MD 01/28/2018, 10:09 AM

## 2018-01-28 NOTE — Progress Notes (Signed)
Arrived from ED. Responds to voice. Orientedx4. Denies any pain. Call light within reach.

## 2018-01-28 NOTE — ED Notes (Signed)
Pt found in room standing by side of bed, IV pulled out, stool all over the floor, the bed, and pt. Pt was cleaned and placed back in bed. Pt pulled to hallway for closer monitoring.

## 2018-01-28 NOTE — Care Management Note (Signed)
Case Management Note  Patient Details  Name: [name] [name] MRN: [id number] Date of Birth: [birth year]  Subjective/Objective:         Admitted with AMS / Salicylate intoxication, hx of depression, endometrial cancer, tobacco abuse. Resides alone. Pt is a Marine scientist. Supportive sisters. Independent with ADL's, no DME usage PTA.  Oscar La (Sister) [place] (Sister)    [phone]      PCP:          Action/Plan:  Discharge planning in process ....NCM following for transitional care needs.  Expected Discharge Date:                  Expected Discharge Plan:  Home/Self Care  In-House Referral:     Discharge planning Services  CM Consult  Post Acute Care Choice:    Choice offered to:     DME Arranged:    DME Agency:     HH Arranged:    HH Agency:     Status of Service:  In process, will continue to follow  If discussed at Long Length of Stay Meetings, dates discussed:    Additional Comments:  Sharin Mons, RN 01/28/2018, 8:41 AM

## 2018-01-28 NOTE — Progress Notes (Signed)
PROGRESS NOTE    [name] [name]  UJW:[id number] DOB: [birth year] DOA: 01/27/2018 PCP: Patient, No Pcp Per    Brief Narrative:  62 year old female who presented with altered mental status.  Patient does have a significant past medical history of chronic migraines, depression, endometrial cancer and tobacco abuse.  Most of the information obtained from the emergency department due to patient being significantly altered.  Apparently for the last 7 days prior to hospitalization, patient was noted not to be herself, on the day of admission she was found significantly altered, having hallucinations and significant disorientation.  On the initial physical examination blood pressure 110/59, heart rate 66, respiratory 16, saturation 96%, dry mucous membranes, lungs clear to auscultation bilaterally, heart S1-2 present rhythmic, abdomen soft nontender, she was confused, somnolent, disoriented but nonfocal.  Sodium 139, potassium 4.6, chloride 105, bicarb 15, glucose 101, BUN 17, creatinine 1.21, calcium 8.3, anion gap 16, Mcgroarty count 6.4, hemoglobin 14.1, hematocrit 44.7, platelets 184.  Salicylate level 56.2.  Urine analysis, specific gravity 1.013, pH 5, RBCs 6-30, Siegrist cells 0-5.  Head CT negative for acute changes.  EKG sinus rhythm, rate 83 bpm, normal axis, normal intervals.  Chest x-ray right rotation, no infiltrates.  Patient was admitted to the hospital diagnosis of toxic encephalopathy due to salicylate intoxication, complicated by anion gap metabolic acidosis and acute kidney injury.    Assessment & Plan:   Principal Problem:   Salicylate intoxication Active Problems:   Tobacco abuse   Acute metabolic encephalopathy   AKI (acute kidney injury) ([place])   Metabolic acidosis   1. Toxic encephalopathy due to salicylate intoxication. Will continue neuro checks q 4 hours, patient is more awake and alert, advance diet as tolerated. Will continue IV fluids with bicarb drip for urine alkalinization,  serum bicarbonate at  20, with serum K at 3,5 and cr at 0.90. No signs of volume overload, will continue renal panel monitoring q 6 hours. Salicylate level down to 37 from 49.5   2. Anion gap metabolic acidosis. Improving serum bicarbonate, will continue drip at 125 ml per hour, no signs of volume overload.   3. AKI. Improved renal function with IV fluids, serum cr down to 0.90, will continue follow up on renal function and electrolytes, avoid nephrotoxic agents and hypotension.   4. Tobacco abuse. Continue nicotine replacement with nicotine pactch.   5. Chronic headache. Acetaminophen for pain control.    DVT prophylaxis: enoxaparin   Code Status:  full Family Communication: I spoke with patient's family at the bedside and all questions were addressed.  Disposition Plan: home when clinically stable    Consultants:   Nephrology  Procedures:     Antimicrobials:      Subjective: Patient is more awake and reactive, no nausea or vomiting, no chest pain or dyspnea. Feeling very weak and deconditioned.   Objective: Vitals:   01/28/18 0130 01/28/18 0230 01/28/18 0430 01/28/18 0548  BP: 111/60 122/69 112/61 113/61  Pulse: 64 (!) 57 62 (!) 51  Resp: 16 17 17 20   Temp:    98 F (36.7 C)  TempSrc:    Oral  SpO2: 96% 99% 93% 97%  Weight:      Height:        Intake/Output Summary (Last 24 hours) at 01/28/2018 0825 Last data filed at 01/27/2018 1810 Gross per 24 hour  Intake 1000 ml  Output -  Net 1000 ml   Filed Weights   01/27/18 1307  Weight: 78.5 kg (173 lb)  Examination:   General: deconditioned and ill looking appearing Neurology: somnolent, non focal  E ENT: positive pallor, no icterus, oral mucosa dry Cardiovascular: No JVD. S1-S2 present, rhythmic, no gallops, rubs, or murmurs. No lower extremity edema. Pulmonary: decreased breath sounds bilaterally, adequate air movement, no wheezing, rhonchi or rales. Gastrointestinal. Abdomen with no organomegaly, non  tender, no rebound or guarding Skin. No rashes Musculoskeletal: no joint deformities     Data Reviewed: I have personally reviewed following labs and imaging studies  CBC: Recent Labs  Lab 01/27/18 1357 01/28/18 0407  WBC 6.4 5.4  NEUTROABS 4.2  --   HGB 14.1 12.9  HCT 44.7 41.6  MCV 98.7 99.3  PLT 184 532   Basic Metabolic Panel: Recent Labs  Lab 01/27/18 1357 01/28/18 0407  NA 139 143  K 4.6 3.5  CL 108 111  CO2 15* 20*  GLUCOSE 101* 88  BUN 17 17  CREATININE 1.21* 0.90  CALCIUM 8.4* 8.0*   GFR: Estimated Creatinine Clearance: 63.4 mL/min (by C-G formula based on SCr of 0.9 mg/dL). Liver Function Tests: Recent Labs  Lab 01/27/18 1357  AST 29  ALT 11*  ALKPHOS 54  BILITOT 0.8  PROT 7.0  ALBUMIN 3.8   No results for input(s): LIPASE, AMYLASE in the last 168 hours. Recent Labs  Lab 01/28/18 0018  AMMONIA 41*   Coagulation Profile: No results for input(s): INR, PROTIME in the last 168 hours. Cardiac Enzymes: No results for input(s): CKTOTAL, CKMB, CKMBINDEX, TROPONINI in the last 168 hours. BNP (last 3 results) No results for input(s): PROBNP in the last 8760 hours. HbA1C: No results for input(s): HGBA1C in the last 72 hours. CBG: Recent Labs  Lab 01/28/18 0617  GLUCAP 95   Lipid Profile: No results for input(s): CHOL, HDL, LDLCALC, TRIG, CHOLHDL, LDLDIRECT in the last 72 hours. Thyroid Function Tests: No results for input(s): TSH, T4TOTAL, FREET4, T3FREE, THYROIDAB in the last 72 hours. Anemia Panel: No results for input(s): VITAMINB12, FOLATE, FERRITIN, TIBC, IRON, RETICCTPCT in the last 72 hours.    Radiology Studies: I have reviewed all of the imaging during this hospital visit personally     Scheduled Meds: . enoxaparin (LOVENOX) injection  40 mg Subcutaneous Daily  . lactulose  300 mL Rectal BID  . nicotine  21 mg Transdermal Daily   Continuous Infusions: .  sodium bicarbonate  infusion 1000 mL 125 mL/hr at 01/28/18 0415      LOS: 1 day        [name] Kepple Gerome Apley, MD [facility] Pager [phone]

## 2018-01-29 DIAGNOSIS — T39094A Poisoning by salicylates, undetermined, initial encounter: Secondary | ICD-10-CM

## 2018-01-29 DIAGNOSIS — Z720 Tobacco use: Secondary | ICD-10-CM

## 2018-01-29 LAB — BASIC METABOLIC PANEL
ANION GAP: 8 (ref 5–15)
BUN: 6 mg/dL (ref 6–20)
CALCIUM: 8.4 mg/dL — AB (ref 8.9–10.3)
CO2: 29 mmol/L (ref 22–32)
CREATININE: 0.6 mg/dL (ref 0.44–1.00)
Chloride: 104 mmol/L (ref 101–111)
GFR calc Af Amer: >60 mL/min (ref >60)
GFR calc non Af Amer: >60 mL/min (ref >60)
GLUCOSE: 103 mg/dL — AB (ref 65–99)
Potassium: 3.5 mmol/L (ref 3.5–5.1)
Sodium: 141 mmol/L (ref 135–145)

## 2018-01-29 LAB — GLUCOSE, CAPILLARY: Glucose-Capillary: 111 mg/dL — ABNORMAL HIGH (ref 65–99)

## 2018-01-29 LAB — SALICYLATE LEVEL: Salicylate Lvl: <7 mg/dL (ref 2.8–30.0)

## 2018-01-29 MED ORDER — POTASSIUM CHLORIDE CRYS ER 20 MEQ PO TBCR: 40.0000 meq | EXTENDED_RELEASE_TABLET | ORAL | Status: DC

## 2018-01-29 MED ORDER — KCL IN DEXTROSE-NACL 20-5-0.45 MEQ/L-%-% IV SOLN
INTRAVENOUS | Status: DC
  Administered 2018-01-29: 10:00:00 via INTRAVENOUS
  Filled 2018-01-29: qty 1000

## 2018-01-29 MED ORDER — POTASSIUM CHLORIDE CRYS ER 20 MEQ PO TBCR
40.0000 meq | EXTENDED_RELEASE_TABLET | Freq: Once | ORAL | Status: AC
  Administered 2018-01-29: 40 meq via ORAL
  Filled 2018-01-29: qty 2

## 2018-01-29 NOTE — Progress Notes (Addendum)
Patient ID: [name] [name], female   DOB: [birth year], 62 y.o.   MRN: [id number] [facility] Progress Note   Assessment/ Plan:   1. AKI: Possibly hemodynamically mediated with improvement noted overnight with intravenous fluids.  Discussed continued avoidance of nonsteroidal anti-inflammatory drugs.  Discontinue IV fluids. 2.  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    Will sign off at this time, please call if questions or concerns.  Subjective:   Reports to be feeling better with mental status almost 90% of baseline.   Objective:   BP 126/64 (BP Location: Left Arm)   Pulse 66   Temp 98.4 F (36.9 C) (Oral)   Resp 18   Ht 5\' 3"  (1.6 m)   Wt 78.5 kg (173 lb)   SpO2 94%   BMI 30.65 kg/m   Intake/Output Summary (Last 24 hours) at 01/29/2018 1050 Last data filed at 01/29/2018 0400 Gross per 24 hour  Intake 1362.5 ml  Output -  Net 1362.5 ml   Weight change:   Physical Exam: Gen: Comfortably sitting up in bed CVS: Pulse regular rhythm, normal rate, S1 and S2 normal Resp: Clear to auscultation, no rales/rhonchi Abd: Soft, flat, nontender Ext: Trace right ankle edema, no edema left leg.  Imaging: Dg Chest 2 View  Result Date: 01/27/2018 CLINICAL DATA:  Possible stroke. EXAM: CHEST - 2 VIEW COMPARISON:  Chest x-ray dated April 08, 2011. FINDINGS: Borderline cardiomegaly. Normal pulmonary vascularity. Mild central bronchitic changes, likely smoking-related. No focal consolidation, pleural effusion, or pneumothorax. No acute osseous abnormality. IMPRESSION: No active cardiopulmonary disease. Electronically Signed   By: Titus Dubin M.D.   On: 01/27/2018 14:28   Ct Head Wo Contrast  Result Date: 01/27/2018 CLINICAL DATA:  Acute mental status change and slurred speech. Left-sided facial droop. EXAM: CT HEAD WITHOUT CONTRAST TECHNIQUE: Contiguous axial images were  obtained from the base of the skull through the vertex without intravenous contrast. COMPARISON:  None. FINDINGS: Brain: No evidence of acute infarction, hemorrhage, hydrocephalus, extra-axial collection or mass lesion/mass effect. Vascular: No hyperdense vessel or unexpected calcification. Skull: Normal. Negative for fracture or focal lesion. Sinuses/Orbits: No acute finding. Other: None. IMPRESSION: No cause for the patient's symptoms identified. No acute abnormalities. Electronically Signed   By: Dorise Bullion III M.D   On: 01/27/2018 14:41   Mr Brain Wo Contrast  Result Date: 01/27/2018 CLINICAL DATA:  Initial evaluation for possible stroke, altered mental status. EXAM: MRI HEAD WITHOUT CONTRAST TECHNIQUE: Multiplanar, multiecho pulse sequences of the brain and surrounding structures were obtained without intravenous contrast. COMPARISON:  Prior CT from earlier same day. FINDINGS: Brain: Examination is limited as the patient was unable to tolerate the full length of the exam, diffusion-weighted sequences as well as axial FLAIR and gradient echo sequences only were performed. Additionally, images provided are degraded by motion. Diffusion-weighted imaging demonstrates no evidence for acute or subacute ischemia. Gray-Calo matter differentiation maintained. No obvious areas of remote infarction. No acute or chronic intracranial hemorrhage. No appreciable mass lesion. No mass effect or midline shift. No hydrocephalus. No extra-axial fluid collection. Major dural sinuses are grossly patent. Vascular: Major intracranial vascular flow voids are grossly maintained at the skull base. Skull and upper cervical spine: Craniocervical junction not well evaluated on this limited exam. Calvarium grossly intact and normal. No scalp soft tissue abnormality. Sinuses/Orbits: Globes normal soft tissues grossly within normal limits. Paranasal sinuses are largely clear. No  appreciable mastoid effusion. Other: None. IMPRESSION:  1. Limited exam due to patient's inability to tolerate the full length of the study and motion artifact. 2. No acute intracranial infarct. No other definite acute intracranial abnormality identified. Electronically Signed   By: Jeannine Boga M.D.   On: 01/27/2018 21:52    Labs: BMET Recent Labs  Lab 01/27/18 1357 01/28/18 0407 01/28/18 1010 01/28/18 1543 01/28/18 2204 01/29/18 0728  NA 139 143 141 141 142 141  K 4.6 3.5 3.4* 3.1* 2.8* 3.5  CL 108 111 107 106 104 104  CO2 15* 20* 22 24 28 29   GLUCOSE 101* 88 88 94 123* 103*  BUN 17 17 16 15 13 6   CREATININE 1.21* 0.90 0.81 0.75 0.75 0.60  CALCIUM 8.4* 8.0* 8.2* 8.4* 8.2* 8.4*   CBC Recent Labs  Lab 01/27/18 1357 01/28/18 0407  WBC 6.4 5.4  NEUTROABS 4.2  --   HGB 14.1 12.9  HCT 44.7 41.6  MCV 98.7 99.3  PLT 184 163   Medications:    . enoxaparin (LOVENOX) injection  40 mg Subcutaneous Daily  . nicotine  21 mg Transdermal Daily   Elmarie Shiley, MD 01/29/2018, 10:50 AM

## 2018-01-29 NOTE — Evaluation (Signed)
Physical Therapy Evaluation Patient Details Name: [name] [name] MRN: [id number] DOB: December 29, 1955 Today's Date: 01/29/2018   History of Present Illness  Pt is a 62 y/o female admitted secondary to AMS, found to have Salicylate intoxication. PMH including but not limited to endometrial cancer and tobacco abuse.    Clinical Impression  Pt presented supine in bed with HOB elevated, awake and willing to participate in therapy session. Prior to admission, pt reported that she was independent with all functional mobility and ADLs. 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 She also reported that she participates in exercise classes 4-6x/week. Pt currently able to perform bed mobility with modified independence, transfers with supervision and ambulated in hallway without use of an AD with min guard for safety. Pt also participated in stair training with supervision for safety. Pt would continue to benefit from skilled physical therapy services at this time while admitted and after d/c to address the below listed limitations in order to improve overall safety and independence with functional mobility.     Follow Up Recommendations Outpatient PT    Equipment Recommendations  None recommended by PT    Recommendations for Other Services       Precautions / Restrictions Precautions Precautions: None Restrictions Weight Bearing Restrictions: No      Mobility  Bed Mobility Overal bed mobility: Modified Independent                Transfers Overall transfer level: Needs assistance Equipment used: None Transfers: Sit to/from Stand Sit to Stand: Supervision         General transfer comment: supervision for safety  Ambulation/Gait Ambulation/Gait assistance: Min guard Ambulation Distance (Feet): 200 Feet Assistive device: None Gait Pattern/deviations: Step-through pattern;Decreased stride length;Drifts right/left Gait velocity: decreased Gait velocity interpretation: <1.31  ft/sec, indicative of household ambulator General Gait Details: mild instability but no overt LOB or need for physical assistance, min guard for safety  Stairs Stairs: Yes Stairs assistance: Supervision Stair Management: Two rails;Alternating pattern;Forwards Number of Stairs: 5(5 steps x2) General stair comments: no instability or LOB, supervision for safety  Wheelchair Mobility    Modified Rankin (Stroke Patients Only)       Balance Overall balance assessment: Needs assistance Sitting-balance support: Feet supported Sitting balance-Leahy Scale: Normal     Standing balance support: During functional activity;No upper extremity supported Standing balance-Leahy Scale: Fair                               Pertinent Vitals/Pain Pain Assessment: No/denies pain    Home Living Family/patient expects to be discharged to:: Private residence Living Arrangements: Alone Available Help at Discharge: Other (Comment)(none) Type of Home: Other(Comment)(Townhouse) Home Access: Level entry     Home Layout: Two level Home Equipment: Shower seat      Prior Function Level of Independence: Independent         Comments: works as an Therapist, sports; 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 days/week     Hand Dominance        Extremity/Trunk Assessment   Upper Extremity Assessment Upper Extremity Assessment: Overall WFL for tasks assessed    Lower Extremity Assessment Lower Extremity Assessment: Overall WFL for tasks assessed       Communication   Communication: No difficulties  Cognition Arousal/Alertness: Awake/alert Behavior During Therapy: WFL for tasks assessed/performed Overall Cognitive Status: Impaired/Different from baseline Area of Impairment: Problem solving  Problem Solving: Slow processing        General Comments      Exercises     Assessment/Plan    PT Assessment Patient needs continued PT  services  PT Problem List Decreased balance;Decreased mobility;Decreased coordination       PT Treatment Interventions DME instruction;Gait training;Stair training;Functional mobility training;Therapeutic exercise;Therapeutic activities;Balance training;Neuromuscular re-education;Patient/family education    PT Goals (Current goals can be found in the Care Plan section)  Acute Rehab PT Goals Patient Stated Goal: return to PLOF PT Goal Formulation: With patient Time For Goal Achievement: 02/12/18 Potential to Achieve Goals: Good    Frequency Min 3X/week   Barriers to discharge        Co-evaluation               AM-PAC PT "6 Clicks" Daily Activity  Outcome Measure Difficulty turning over in bed (including adjusting bedclothes, sheets and blankets)?: None Difficulty moving from lying on back to sitting on the side of the bed? : None Difficulty sitting down on and standing up from a chair with arms (e.g., wheelchair, bedside commode, etc,.)?: None Help needed moving to and from a bed to chair (including a wheelchair)?: None Help needed walking in hospital room?: A Little Help needed climbing 3-5 steps with a railing? : A Little 6 Click Score: 22    End of Session Equipment Utilized During Treatment: Gait belt Activity Tolerance: Patient tolerated treatment well Patient left: in bed;with call bell/phone within reach Nurse Communication: Mobility status PT Visit Diagnosis: Other abnormalities of gait and mobility (R26.89)    Time: 8466-5993 PT Time Calculation (min) (ACUTE ONLY): 15 min   Charges:   PT Evaluation $PT Eval Moderate Complexity: 1 Mod     PT G Codes:        [place], PT, DPT [place] 01/29/2018, 5:50 PM

## 2018-01-29 NOTE — Progress Notes (Signed)
PROGRESS NOTE    VIRGEN BELLAND  WIO:[id number] DOB: [birth year] DOA: 01/27/2018 PCP: Patient, No Pcp Per    Brief Narrative:  62 year old female who presented with altered mental status.  Patient does have a significant past medical history of chronic migraines, depression, endometrial cancer and tobacco abuse.  Most of the information obtained from the emergency department due to patient being significantly altered.  Apparently for the last 7 days prior to hospitalization, patient was noted not to be herself, on the day of admission she was found significantly altered, having hallucinations and significant disorientation.  On the initial physical examination blood pressure 110/59, heart rate 66, respiratory 16, saturation 96%, dry mucous membranes, lungs clear to auscultation bilaterally, heart S1-2 present rhythmic, abdomen soft nontender, she was confused, somnolent, disoriented but nonfocal.  Sodium 139, potassium 4.6, chloride 105, bicarb 15, glucose 101, BUN 17, creatinine 1.21, calcium 8.3, anion gap 16, Sole count 6.4, hemoglobin 14.1, hematocrit 44.7, platelets 184.  Salicylate level 42.6.  Urine analysis, specific gravity 1.013, pH 5, RBCs 6-30, Dahmen cells 0-5.  Head CT negative for acute changes.  EKG sinus rhythm, rate 83 bpm, normal axis, normal intervals.  Chest x-ray right rotation, no infiltrates.  Patient was admitted to the hospital diagnosis of toxic encephalopathy due to salicylate intoxication, complicated by anion gap metabolic acidosis and acute kidney injury.    Assessment & Plan:   Principal Problem:   Salicylate intoxication Active Problems:   Tobacco abuse   Acute metabolic encephalopathy   AKI (acute kidney injury) ([place])   Metabolic acidosis   1. Toxic encephalopathy due to salicylate intoxication. Patient is more awake and alert. Ambulating with assistance. Salicylate level down to 20.5, serum bicarbonate at 28 and serum K at 2,8. Will discontinue bicarb  infusion and will continue hydration with half d5 half normal saline with K supplementation. No urine output recorded. Out of bed as tolerated, ambulate and physical therapy, discontinue neuro checks and telemetry.   2. Anion gap metabolic acidosis. Resolved acidosis, today with metabolic alkalosis, will discontinue bicarb infusion and continue hydration with hypotonic saline. Avoid further aspirin.   3. AKI and new hypokalemia. Preserved renal function with serum cr at 0.75, positive metabolic alkalosis, will continue to correct K and will check mg levels. Follow on renal panel this am for further K repletion, patient had 80 meq last night.   4. Tobacco abuse. Continue nicotine pactch.   5. Chronic headache. As needed Acetaminophen for pain control.    DVT prophylaxis: enoxaparin   Code Status:  full Family Communication: no family at the bedside.  Disposition Plan: home when clinically stable    Consultants:   Nephrology  Procedures:     Antimicrobials:      Subjective: Patient awake and alert, has been out of bed with assistance. No nausea or vomiting, no dyspnea or chest pain, no headache.   Objective: Vitals:   01/28/18 2027 01/29/18 0008 01/29/18 0440 01/29/18 0456  BP: 123/61 126/67 94/85 133/66  Pulse: 75 64 77 66  Resp: 20 18 18    Temp: 98.1 F (36.7 C) 98.1 F (36.7 C) 98.5 F (36.9 C) 98.7 F (37.1 C)  TempSrc: Oral Oral Oral Oral  SpO2: 93% 91% 94% 92%  Weight:      Height:        Intake/Output Summary (Last 24 hours) at 01/29/2018 0801 Last data filed at 01/29/2018 0400 Gross per 24 hour  Intake 1362.5 ml  Output -  Net 1362.5 ml  Filed Weights   01/27/18 1307  Weight: 78.5 kg (173 lb)    Examination:   General: deconditioned Neurology: Awake and alert, non focal  E ENT: no pallor, no icterus, oral mucosa moist Cardiovascular: No JVD. S1-S2 present, rhythmic, no gallops, rubs, or murmurs. No lower extremity edema. Pulmonary:  vesicular breath sounds bilaterally, adequate air movement, no wheezing, rhonchi or rales. Gastrointestinal. Abdomen with no organomegaly, non tender, no rebound or guarding Skin. No rashes Musculoskeletal: no joint deformities     Data Reviewed: I have personally reviewed following labs and imaging studies  CBC: Recent Labs  Lab 01/27/18 1357 01/28/18 0407  WBC 6.4 5.4  NEUTROABS 4.2  --   HGB 14.1 12.9  HCT 44.7 41.6  MCV 98.7 99.3  PLT 184 322   Basic Metabolic Panel: Recent Labs  Lab 01/27/18 1357 01/28/18 0407 01/28/18 1010 01/28/18 1543 01/28/18 2204  NA 139 143 141 141 142  K 4.6 3.5 3.4* 3.1* 2.8*  CL 108 111 107 106 104  CO2 15* 20* 22 24 28   GLUCOSE 101* 88 88 94 123*  BUN 17 17 16 15 13   CREATININE 1.21* 0.90 0.81 0.75 0.75  CALCIUM 8.4* 8.0* 8.2* 8.4* 8.2*   GFR: Estimated Creatinine Clearance: 71.4 mL/min (by C-G formula based on SCr of 0.75 mg/dL). Liver Function Tests: Recent Labs  Lab 01/27/18 1357  AST 29  ALT 11*  ALKPHOS 54  BILITOT 0.8  PROT 7.0  ALBUMIN 3.8   No results for input(s): LIPASE, AMYLASE in the last 168 hours. Recent Labs  Lab 01/28/18 0018  AMMONIA 41*   Coagulation Profile: No results for input(s): INR, PROTIME in the last 168 hours. Cardiac Enzymes: No results for input(s): CKTOTAL, CKMB, CKMBINDEX, TROPONINI in the last 168 hours. BNP (last 3 results) No results for input(s): PROBNP in the last 8760 hours. HbA1C: No results for input(s): HGBA1C in the last 72 hours. CBG: Recent Labs  Lab 01/28/18 0617 01/29/18 0626  GLUCAP 95 111*   Lipid Profile: No results for input(s): CHOL, HDL, LDLCALC, TRIG, CHOLHDL, LDLDIRECT in the last 72 hours. Thyroid Function Tests: No results for input(s): TSH, T4TOTAL, FREET4, T3FREE, THYROIDAB in the last 72 hours. Anemia Panel: No results for input(s): VITAMINB12, FOLATE, FERRITIN, TIBC, IRON, RETICCTPCT in the last 72 hours.    Radiology Studies: I have reviewed  all of the imaging during this hospital visit personally     Scheduled Meds: . enoxaparin (LOVENOX) injection  40 mg Subcutaneous Daily  . nicotine  21 mg Transdermal Daily  . pneumococcal 23 valent vaccine  0.5 mL Intramuscular Tomorrow-1000  . potassium chloride  40 mEq Oral Q4H   Continuous Infusions:   LOS: 2 days        Mauricio Gerome Apley, MD [facility] Pager [phone]

## 2018-01-30 LAB — BASIC METABOLIC PANEL
Anion gap: 6 (ref 5–15)
BUN: <5 mg/dL — ABNORMAL LOW (ref 6–20)
CALCIUM: 8.8 mg/dL — AB (ref 8.9–10.3)
CO2: 26 mmol/L (ref 22–32)
Chloride: 108 mmol/L (ref 101–111)
Creatinine, Ser: 0.61 mg/dL (ref 0.44–1.00)
GFR calc non Af Amer: >60 mL/min (ref >60)
Glucose, Bld: 89 mg/dL (ref 65–99)
Potassium: 4 mmol/L (ref 3.5–5.1)
Sodium: 140 mmol/L (ref 135–145)

## 2018-01-30 MED ORDER — ACETAMINOPHEN 500 MG PO TABS: 500.0000 mg | ORAL_TABLET | Freq: Four times a day (QID) | ORAL | 0 refills | Status: AC | PRN

## 2018-01-30 NOTE — Discharge Summary (Signed)
Physician Discharge Summary  [name] [name] HEN:[id number] DOB: 10/06/1956 DOA: 01/27/2018  PCP: Patient, No Pcp Per  Admit date: 01/27/2018 Discharge date: 01/30/2018  Admitted From: home Disposition:  home  Recommendations for Outpatient Follow-up and new medication changes:  1. Follow up with PCP in 1-week 2. Patient was advised to avoid aspirin or NSAIDs 3. Use Acetaminophen for pain control 4. Note given to return to work on 02/02/2018.  Home Health: no   Equipment/Devices: no    Discharge Condition: stable CODE STATUS: full  Diet recommendation: regular.  Brief/Interim Summary: 62 year old female who presented with altered mental status. Patient does have a significant past medical history of chronic migraines, depression, endometrialcancer and tobacco abuse. Most of the information obtained from the emergency department due to patient being significantly altered. Apparently for the last 7 days prior to hospitalization, patient was noted not to be herself, on the day of admission she was found significantly altered, having hallucinations and disorientation. On the initial physical examination blood pressure 110/59, heart rate 66, respiratory rate 16, oxygen saturation 96%,dry mucous membranes, lungs clear to auscultation bilaterally, heart S1-2 present rhythmic, abdomen soft nontender, she was confused, somnolent, disoriented but nonfocal.Sodium 139, potassium 4.6, chloride 105, bicarb 15, glucose 101, BUN 17, creatinine 1.21, calcium 8.3, anion gap 16,[place] count 6.4, hemoglobin 14.1, hematocrit 44.7, platelets184.Salicylate level 49.5.Urine analysis, specific gravity 1.013, pH 5, RBCs 6-30, [place] cells 0-5.Head CT negative for acute changes. EKG sinus rhythm, rate 83 bpm, normal axis, normal intervals. Chest x-ray right rotation, no infiltrates.  Patient was admitted to the hospital with the working diagnosis of toxic encephalopathy due to salicylate  intoxication,complicated by anion gap metabolic acidosis and acute kidney injury.  1.  Toxic encephalopathy due to salicylate intoxication.  Patient was admitted to the medical ward, she was placed on a remote telemetry monitor, neuro checks every 4 hours, IV fluids with bicarbonate drip for urine alkalinization.  Patient's condition improved, his neurologic status returned back to baseline, she was seen by physical therapy with recommendations for outpatient therapy.  2.  Anion gap metabolic acidosis due to salicylate intoxication.  Patient was treated with intravenous bicarbonate, for urine alkalinization, she tolerated aggressive hydration well with no signs of volume overload.  Her acidosis resolved, discharge bicarb 26.  The salicylate level at discharge less than 7.0.  3.  Acute kidney injury with hypokalemia.  Prerenal renal failure, salicylate toxicity, clinically improved with supportive IV fluids with bicarbonate infusion.  Patient did develop a transit metabolic alkalosis with consequent hypokalemia.  Bicarb was discontinued and patient received potassium chloride. Her discharge creatinine 0.61.   4.  Tobacco abuse.  Patient was counseled about smoking cessation, given nicotine patch while hospitalized.  5.  Migraine headaches.  Continue as needed sumatriptan, avoid ibuprofen    Discharge Diagnoses:  Principal Problem:   Salicylate intoxication Active Problems:   Tobacco abuse   Acute metabolic encephalopathy   AKI (acute kidney injury) ([name])   Metabolic acidosis    Discharge Instructions   Allergies as of 01/30/2018      Reactions   Guaifenesin Er Palpitations   Codeine    hyper      Medication List    STOP taking these medications   ADVIL 200 MG Caps Generic drug:  Ibuprofen     TAKE these medications   acetaminophen 500 MG tablet Commonly known as:  TYLENOL Take 1 tablet (500 mg total) by mouth every 6 (six) hours as needed for headache.   IMITREX  PO Take 1 tablet by mouth as needed.       Allergies  Allergen Reactions  . Guaifenesin Er Palpitations  . Codeine     hyper    Consultations:  Nephrology    Procedures/Studies: Dg Chest 2 View  Result Date: 01/27/2018 CLINICAL DATA:  Possible stroke. EXAM: CHEST - 2 VIEW COMPARISON:  Chest x-ray dated April 08, 2011. FINDINGS: Borderline cardiomegaly. Normal pulmonary vascularity. Mild central bronchitic changes, likely smoking-related. No focal consolidation, pleural effusion, or pneumothorax. No acute osseous abnormality. IMPRESSION: No active cardiopulmonary disease. Electronically Signed   By: Titus Dubin M.D.   On: 01/27/2018 14:28   Ct Head Wo Contrast  Result Date: 01/27/2018 CLINICAL DATA:  Acute mental status change and slurred speech. Left-sided facial droop. EXAM: CT HEAD WITHOUT CONTRAST TECHNIQUE: Contiguous axial images were obtained from the base of the skull through the vertex without intravenous contrast. COMPARISON:  None. FINDINGS: Brain: No evidence of acute infarction, hemorrhage, hydrocephalus, extra-axial collection or mass lesion/mass effect. Vascular: No hyperdense vessel or unexpected calcification. Skull: Normal. Negative for fracture or focal lesion. Sinuses/Orbits: No acute finding. Other: None. IMPRESSION: No cause for the patient's symptoms identified. No acute abnormalities. Electronically Signed   By: Dorise Bullion III M.D   On: 01/27/2018 14:41   Mr Brain Wo Contrast  Result Date: 01/27/2018 CLINICAL DATA:  Initial evaluation for possible stroke, altered mental status. EXAM: MRI HEAD WITHOUT CONTRAST TECHNIQUE: Multiplanar, multiecho pulse sequences of the brain and surrounding structures were obtained without intravenous contrast. COMPARISON:  Prior CT from earlier same day. FINDINGS: Brain: Examination is limited as the patient was unable to tolerate the full length of the exam, diffusion-weighted sequences as well as axial FLAIR and gradient  echo sequences only were performed. Additionally, images provided are degraded by motion. Diffusion-weighted imaging demonstrates no evidence for acute or subacute ischemia. Gray-Theilen matter differentiation maintained. No obvious areas of remote infarction. No acute or chronic intracranial hemorrhage. No appreciable mass lesion. No mass effect or midline shift. No hydrocephalus. No extra-axial fluid collection. Major dural sinuses are grossly patent. Vascular: Major intracranial vascular flow voids are grossly maintained at the skull base. Skull and upper cervical spine: Craniocervical junction not well evaluated on this limited exam. Calvarium grossly intact and normal. No scalp soft tissue abnormality. Sinuses/Orbits: Globes normal soft tissues grossly within normal limits. Paranasal sinuses are largely clear. No appreciable mastoid effusion. Other: None. IMPRESSION: 1. Limited exam due to patient's inability to tolerate the full length of the study and motion artifact. 2. No acute intracranial infarct. No other definite acute intracranial abnormality identified. Electronically Signed   By: Jeannine Boga M.D.   On: 01/27/2018 21:52       Subjective: Patient is feeling better, no nausea or vomiting, no chest pain or dyspnea.   Discharge Exam: Vitals:   01/30/18 0003 01/30/18 0430  BP: (!) 145/72 (!) 151/80  Pulse: (!) 56 (!) 50  Resp: 18 18  Temp: 97.9 F (36.6 C) 98.6 F (37 C)  SpO2:  95%   Vitals:   01/29/18 1620 01/29/18 2046 01/30/18 0003 01/30/18 0430  BP: 134/65 122/73 (!) 145/72 (!) 151/80  Pulse: 60 61 (!) 56 (!) 50  Resp: 18 20 18 18   Temp: 97.8 F (36.6 C) 98.5 F (36.9 C) 97.9 F (36.6 C) 98.6 F (37 C)  TempSrc: Axillary Oral Oral Oral  SpO2: 96% 94%  95%  Weight:      Height:  General: Not in pain or dyspnea.  Neurology: Awake and alert, non focal  E ENT: no pallor, no icterus, oral mucosa moist Cardiovascular: No JVD. S1-S2 present, rhythmic, no  gallops, rubs, or murmurs. No lower extremity edema. Pulmonary: vesicular breath sounds bilaterally, adequate air movement, no wheezing, rhonchi or rales. Gastrointestinal. Abdomen flat, no organomegaly, non tender, no rebound or guarding Skin. No rashes Musculoskeletal: no joint deformities   The results of significant diagnostics from this hospitalization (including imaging, microbiology, ancillary and laboratory) are listed below for reference.     Microbiology: No results found for this or any previous visit (from the past 240 hour(s)).   Labs: BNP (last 3 results) No results for input(s): BNP in the last 8760 hours. Basic Metabolic Panel: Recent Labs  Lab 01/28/18 1010 01/28/18 1543 01/28/18 2204 01/29/18 0728 01/30/18 0321  NA 141 141 142 141 140  K 3.4* 3.1* 2.8* 3.5 4.0  CL 107 106 104 104 108  CO2 22 24 28 29 26   GLUCOSE 88 94 123* 103* 89  BUN 16 15 13 6  <5*  CREATININE 0.81 0.75 0.75 0.60 0.61  CALCIUM 8.2* 8.4* 8.2* 8.4* 8.8*   Liver Function Tests: Recent Labs  Lab 01/27/18 1357  AST 29  ALT 11*  ALKPHOS 54  BILITOT 0.8  PROT 7.0  ALBUMIN 3.8   No results for input(s): LIPASE, AMYLASE in the last 168 hours. Recent Labs  Lab 01/28/18 0018  AMMONIA 41*   CBC: Recent Labs  Lab 01/27/18 1357 01/28/18 0407  WBC 6.4 5.4  NEUTROABS 4.2  --   HGB 14.1 12.9  HCT 44.7 41.6  MCV 98.7 99.3  PLT 184 163   Cardiac Enzymes: No results for input(s): CKTOTAL, CKMB, CKMBINDEX, TROPONINI in the last 168 hours. BNP: Invalid input(s): POCBNP CBG: Recent Labs  Lab 01/28/18 0617 01/29/18 0626  GLUCAP 95 111*   D-Dimer No results for input(s): DDIMER in the last 72 hours. Hgb A1c No results for input(s): HGBA1C in the last 72 hours. Lipid Profile No results for input(s): CHOL, HDL, LDLCALC, TRIG, CHOLHDL, LDLDIRECT in the last 72 hours. Thyroid function studies No results for input(s): TSH, T4TOTAL, T3FREE, THYROIDAB in the last 72  hours.  Invalid input(s): FREET3 Anemia work up No results for input(s): VITAMINB12, FOLATE, FERRITIN, TIBC, IRON, RETICCTPCT in the last 72 hours. Urinalysis    Component Value Date/Time   COLORURINE YELLOW 01/27/2018 1940   APPEARANCEUR CLEAR 01/27/2018 1940   LABSPEC 1.013 01/27/2018 1940   PHURINE 5.0 01/27/2018 1940   GLUCOSEU NEGATIVE 01/27/2018 1940   HGBUR MODERATE (A) 01/27/2018 1940   BILIRUBINUR NEGATIVE 01/27/2018 1940   KETONESUR 20 (A) 01/27/2018 1940   PROTEINUR NEGATIVE 01/27/2018 1940   UROBILINOGEN 0.2 03/05/2011 1200   NITRITE NEGATIVE 01/27/2018 1940   LEUKOCYTESUR NEGATIVE 01/27/2018 1940   Sepsis Labs Invalid input(s): PROCALCITONIN,  WBC,  LACTICIDVEN Microbiology No results found for this or any previous visit (from the past 240 hour(s)).   Time coordinating discharge: 45 minutes  SIGNED:   Tawni Millers, MD  [facility] 01/30/2018, 7:58 AM Pager [phone]  If 7PM-7AM, please contact night-coverage [email or web address] Password [email or web address]

## 2018-01-30 NOTE — Progress Notes (Signed)
Assumed care from Carrolyn Meiers, RN. Patient stable alert and oriented.

## 2019-08-03 ENCOUNTER — Other Ambulatory Visit: Payer: Self-pay

## 2019-08-03 DIAGNOSIS — Z20822 Contact with and (suspected) exposure to covid-19: Secondary | ICD-10-CM

## 2019-08-05 LAB — NOVEL CORONAVIRUS, NAA: SARS-CoV-2, NAA: NOT DETECTED

## 2019-12-02 ENCOUNTER — Ambulatory Visit: Payer: 59 | Attending: Internal Medicine

## 2019-12-02 DIAGNOSIS — Z23 Encounter for immunization: Secondary | ICD-10-CM | POA: Insufficient documentation

## 2019-12-02 NOTE — Progress Notes (Signed)
   Covid-19 Vaccination Clinic  Name:  [name] [name]    MRN: [id number] DOB: December 02, 1955  12/02/2019  [name] was observed post Covid-19 immunization for 15 minutes without incidence. She was provided with Vaccine Information Sheet and instruction to access the V-Safe system.   [name] was instructed to call 911 with any severe reactions post vaccine: Marland Kitchen Difficulty breathing  . Swelling of your face and throat  . A fast heartbeat  . A bad rash all over your body  . Dizziness and weakness    Immunizations Administered    Name Date Dose VIS Date Route   Pfizer COVID-19 Vaccine 12/02/2019  8:22 AM 0.3 mL 09/29/2019 Intramuscular   Manufacturer: [place]   Lot: [id number]   [name]: [id number]

## 2019-12-24 ENCOUNTER — Ambulatory Visit: Payer: 59 | Attending: Internal Medicine

## 2019-12-24 DIAGNOSIS — Z23 Encounter for immunization: Secondary | ICD-10-CM | POA: Insufficient documentation

## 2019-12-24 NOTE — Progress Notes (Signed)
   Covid-19 Vaccination Clinic  Name:  [name] [name]    MRN: [id number] DOB: 10-12-1956  12/24/2019  [name] was observed post Covid-19 immunization for 15 minutes without incident. She was provided with Vaccine Information Sheet and instruction to access the V-Safe system.   [name] was instructed to call 911 with any severe reactions post vaccine: Marland Kitchen Difficulty breathing  . Swelling of face and throat  . A fast heartbeat  . A bad rash all over body  . Dizziness and weakness   Immunizations Administered    Name Date Dose VIS Date Route   Pfizer COVID-19 Vaccine 12/24/2019  9:31 AM 0.3 mL 09/29/2019 Intramuscular   Manufacturer: [place]   Lot: [id number]   [place]: [id number]

## 2020-12-25 ENCOUNTER — Other Ambulatory Visit: Payer: Self-pay | Admitting: Family Medicine

## 2020-12-25 DIAGNOSIS — Z1231 Encounter for screening mammogram for malignant neoplasm of breast: Secondary | ICD-10-CM

## 2021-04-14 ENCOUNTER — Ambulatory Visit
Admission: RE | Admit: 2021-04-14 | Discharge: 2021-04-14 | Disposition: A | Discharge status: Home / Self Care | Payer: 59 | Source: Ambulatory Visit | Attending: Family Medicine | Admitting: Family Medicine

## 2021-04-14 ENCOUNTER — Other Ambulatory Visit: Payer: Self-pay

## 2021-04-14 DIAGNOSIS — Z1231 Encounter for screening mammogram for malignant neoplasm of breast: Secondary | ICD-10-CM

## 2021-06-05 ENCOUNTER — Ambulatory Visit: Payer: No Typology Code available for payment source

## 2021-08-11 ENCOUNTER — Ambulatory Visit: Payer: No Typology Code available for payment source

## 2024-01-24 DIAGNOSIS — D3131 Benign neoplasm of right choroid: Secondary | ICD-10-CM | POA: Diagnosis not present

## 2024-08-06 DIAGNOSIS — J449 Chronic obstructive pulmonary disease, unspecified: Secondary | ICD-10-CM | POA: Diagnosis not present

## 2024-08-06 DIAGNOSIS — Z20822 Contact with and (suspected) exposure to covid-19: Secondary | ICD-10-CM | POA: Diagnosis not present

## 2024-08-06 DIAGNOSIS — R918 Other nonspecific abnormal finding of lung field: Secondary | ICD-10-CM | POA: Diagnosis not present

## 2024-08-06 DIAGNOSIS — J189 Pneumonia, unspecified organism: Secondary | ICD-10-CM | POA: Diagnosis not present

## 2024-08-06 DIAGNOSIS — R051 Acute cough: Secondary | ICD-10-CM | POA: Diagnosis not present
